# Patient Record
Sex: Male | Born: 1995 | Race: White | Hispanic: No | Marital: Single | Smoking: Former smoker
Health system: Southern US, Community
[De-identification: ages and names within clinical notes are randomized; demographics above are authoritative.]

## PROBLEM LIST (undated history)

## (undated) DIAGNOSIS — X838XXA Intentional self-harm by other specified means, initial encounter: Secondary | ICD-10-CM

## (undated) DIAGNOSIS — J45909 Unspecified asthma, uncomplicated: Secondary | ICD-10-CM

## (undated) DIAGNOSIS — F319 Bipolar disorder, unspecified: Secondary | ICD-10-CM

## (undated) HISTORY — PX: CHOLECYSTECTOMY: SHX55

---
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
## 2013-08-20 ENCOUNTER — Emergency Department (HOSPITAL_COMMUNITY): Payer: PRIVATE HEALTH INSURANCE

## 2013-08-20 ENCOUNTER — Emergency Department (HOSPITAL_COMMUNITY)
Admission: EM | Admit: 2013-08-20 | Discharge: 2013-08-27 | Disposition: A | Discharge status: Other Acute Inpatient Hospital | Payer: PRIVATE HEALTH INSURANCE | Attending: Emergency Medicine | Admitting: Emergency Medicine

## 2013-08-20 ENCOUNTER — Encounter (HOSPITAL_COMMUNITY): Payer: Self-pay | Admitting: *Deleted

## 2013-08-20 DIAGNOSIS — Y939 Activity, unspecified: Secondary | ICD-10-CM | POA: Insufficient documentation

## 2013-08-20 DIAGNOSIS — W3400XA Accidental discharge from unspecified firearms or gun, initial encounter: Secondary | ICD-10-CM

## 2013-08-20 DIAGNOSIS — Y9289 Other specified places as the place of occurrence of the external cause: Secondary | ICD-10-CM | POA: Insufficient documentation

## 2013-08-20 DIAGNOSIS — Y249XXA Unspecified firearm discharge, undetermined intent, initial encounter: Secondary | ICD-10-CM

## 2013-08-20 DIAGNOSIS — X72XXXA Intentional self-harm by handgun discharge, initial encounter: Secondary | ICD-10-CM | POA: Insufficient documentation

## 2013-08-20 DIAGNOSIS — F319 Bipolar disorder, unspecified: Secondary | ICD-10-CM | POA: Insufficient documentation

## 2013-08-20 DIAGNOSIS — S81009A Unspecified open wound, unspecified knee, initial encounter: Secondary | ICD-10-CM | POA: Insufficient documentation

## 2013-08-20 HISTORY — DX: Bipolar disorder, unspecified: F31.9

## 2013-08-20 HISTORY — DX: Unspecified asthma, uncomplicated: J45.909

## 2013-08-20 LAB — CBC WITH DIFFERENTIAL/PLATELET
Eosinophils Relative: 1 % (ref 0–5)
Hemoglobin: 14.7 g/dL (ref 13.0–17.0)
Lymphocytes Relative: 25 % (ref 12–46)
Lymphs Abs: 2.5 10*3/uL (ref 0.7–4.0)
MCV: 91 fL (ref 78.0–100.0)
Monocytes Relative: 8 % (ref 3–12)
Platelets: 250 10*3/uL (ref 150–400)
RBC: 4.68 MIL/uL (ref 4.22–5.81)
WBC: 9.7 10*3/uL (ref 4.0–10.5)

## 2013-08-20 LAB — SALICYLATE LEVEL: Salicylate Lvl: <2 mg/dL — ABNORMAL LOW (ref 2.8–20.0)

## 2013-08-20 LAB — BASIC METABOLIC PANEL
BUN: 10 mg/dL (ref 6–23)
CO2: 24 mEq/L (ref 19–32)
Calcium: 9.4 mg/dL (ref 8.4–10.5)
Glucose, Bld: 121 mg/dL — ABNORMAL HIGH (ref 70–99)
Potassium: 4.1 mEq/L (ref 3.5–5.1)
Sodium: 137 mEq/L (ref 135–145)

## 2013-08-20 LAB — ETHANOL: Alcohol, Ethyl (B): <11 mg/dL (ref 0–11)

## 2013-08-20 LAB — RAPID URINE DRUG SCREEN, HOSP PERFORMED
Benzodiazepines: NOT DETECTED
Cocaine: NOT DETECTED

## 2013-08-20 MED ORDER — OLANZAPINE 5 MG PO TABS
20.0000 mg | ORAL_TABLET | Freq: Every day | ORAL | Status: DC
  Administered 2013-08-20 – 2013-08-26 (×7): 20 mg via ORAL
  Filled 2013-08-20 (×8): qty 4

## 2013-08-20 MED ORDER — IBUPROFEN 400 MG PO TABS
600.0000 mg | ORAL_TABLET | Freq: Three times a day (TID) | ORAL | Status: DC | PRN
  Administered 2013-08-20 – 2013-08-27 (×9): 600 mg via ORAL
  Filled 2013-08-20: qty 3
  Filled 2013-08-20: qty 1
  Filled 2013-08-20 (×2): qty 3
  Filled 2013-08-20 (×2): qty 1
  Filled 2013-08-20: qty 3
  Filled 2013-08-20 (×2): qty 1
  Filled 2013-08-20 (×2): qty 3
  Filled 2013-08-20: qty 1

## 2013-08-20 MED ORDER — ONDANSETRON HCL 4 MG PO TABS: 4.0000 mg | ORAL_TABLET | Freq: Three times a day (TID) | ORAL | Status: DC | PRN

## 2013-08-20 MED ORDER — TETANUS-DIPHTH-ACELL PERTUSSIS 5-2.5-18.5 LF-MCG/0.5 IM SUSP
0.5000 mL | Freq: Once | INTRAMUSCULAR | Status: AC
  Administered 2013-08-20: 0.5 mL via INTRAMUSCULAR
  Filled 2013-08-20: qty 0.5

## 2013-08-20 MED ORDER — CLONIDINE HCL 0.2 MG PO TABS
0.3000 mg | ORAL_TABLET | Freq: Every day | ORAL | Status: DC
  Administered 2013-08-20 – 2013-08-26 (×6): 0.3 mg via ORAL
  Filled 2013-08-20 (×14): qty 1

## 2013-08-20 MED ORDER — DIVALPROEX SODIUM ER 500 MG PO TB24
1500.0000 mg | ORAL_TABLET | Freq: Every day | ORAL | Status: DC
  Administered 2013-08-20 – 2013-08-26 (×7): 1500 mg via ORAL
  Filled 2013-08-20 (×13): qty 3

## 2013-08-20 MED ORDER — NICOTINE 21 MG/24HR TD PT24
21.0000 mg | MEDICATED_PATCH | Freq: Every day | TRANSDERMAL | Status: DC
  Administered 2013-08-20 – 2013-08-27 (×8): 21 mg via TRANSDERMAL
  Filled 2013-08-20 (×8): qty 1

## 2013-08-20 MED ORDER — CEFAZOLIN SODIUM 1-5 GM-% IV SOLN
1.0000 g | Freq: Once | INTRAVENOUS | Status: AC
  Administered 2013-08-20: 1 g via INTRAVENOUS
  Filled 2013-08-20: qty 50

## 2013-08-20 MED ORDER — HYDROMORPHONE HCL PF 1 MG/ML IJ SOLN
1.0000 mg | Freq: Once | INTRAMUSCULAR | Status: AC
  Administered 2013-08-20: 1 mg via INTRAVENOUS
  Filled 2013-08-20: qty 1

## 2013-08-20 NOTE — ED Notes (Signed)
Dressing removed and new dressing applied.  Medium amount of blood noted to dressing from wound.

## 2013-08-20 NOTE — ED Notes (Signed)
Pt doing tele-psych via computer.

## 2013-08-20 NOTE — ED Notes (Signed)
Pts mom, Brazen Domangue called and wanted to be informed when pt was admitted elsewhere.  Call [phone].

## 2013-08-20 NOTE — ED Notes (Signed)
Meal tray provided.
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
## 2013-08-20 NOTE — ED Notes (Signed)
Pts mother took his belongings home with her.

## 2013-08-20 NOTE — ED Notes (Signed)
Pt waiting for tele psych assessment to be done.

## 2013-08-20 NOTE — [facility] Counselor (Signed)
Pt declined [facility], per Verne Spurr, PA the pt requires a higher level of care. Pt information will be passed to [employer] at [facility] to assist with placement.Denice Bors, [facility] 08/20/2013 5:12 PM
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
## 2013-08-20 NOTE — ED Notes (Addendum)
Breakfast meal tray provided.

## 2013-08-20 NOTE — ED Notes (Signed)
Patient took a shower.

## 2013-08-20 NOTE — ED Notes (Signed)
PT requests crutches to be able to ambulate better. EDP notified

## 2013-08-20 NOTE — ED Notes (Signed)
Sitter at bedside pt ate breakfast up to shower rt leg wrapped w/ bags to prevent it getting wet

## 2013-08-21 LAB — CBC
HCT: 42.3 % (ref 39.0–52.0)
MCH: 32.5 pg (ref 26.0–34.0)
MCHC: 35.2 g/dL (ref 30.0–36.0)
MCV: 92.2 fL (ref 78.0–100.0)
Platelets: 212 10*3/uL (ref 150–400)
RBC: 4.59 MIL/uL (ref 4.22–5.81)
WBC: 8.4 10*3/uL (ref 4.0–10.5)

## 2013-08-21 NOTE — Progress Notes (Signed)
B.Duru Reiger, MHT notes that [facility] has declined patient due to acuity. [facility] is reviewing for wait list per [facility] who will confirm wait list later on today.

## 2013-08-21 NOTE — Progress Notes (Signed)
B.Mainor Hellmann, MHT completed [facility] referral with [facility] clinician Clifford obtained authorization number [id number] for 7 days beginning 08/21/13 to 08/27/13. Writer contacted [facility] spoke with Merlyn Albert and requested to submit referral for wait list. Referral packet faxed for review inclusive of all labs, assessment notes, ED provider notes, legal documentation, MAR. Writer will follow up with status of referral

## 2013-08-21 NOTE — Progress Notes (Signed)
B.Guillermina Shaft, MHT received report from [facility] who has declined due to medical acuity. Spoke with Solmon Ice at [facility] who reports a decline as well due to medical acuity. Writer will continue placement search efforts.

## 2013-08-21 NOTE — Progress Notes (Signed)
B.Jayden Kratochvil, MHT completed placement search by contacting the following facilities listed below;   [facility] per [place] fax for review [facility] per Olegario Messier fax for review [facility] per [facility] fax [facility] review [facility] per [facility] fax for review [facility] per [place] fax for review [facility] per [place] at [employer] can send for review with referral form. Referral form completed and faxed along with assessment and labs [facility] per [place] at [facility] per [place] fax for review. Albin Felling reports that fax was received but did not meet cut off will need to resend on next day Shirleen Schirmer per [place] fax for review [place] per [employer] for review [place] per [employer] fax for review [employer] per [place] fax for review [facility] per [place] fax for review [employer] per [employer] for review [employer] per [place] fax for review
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
## 2013-08-21 NOTE — Progress Notes (Signed)
B.Deshia Vanderhoof, MHT received report from [place] at [facility] that patient has been added to wait list as of 11:11 am 08/21/13.
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
## 2013-08-21 NOTE — ED Notes (Signed)
Spoke with Dr. Criss Alvine-- ok to take shower. Pt. Aware.

## 2013-08-22 NOTE — ED Provider Notes (Signed)
Patient is on [facility] waiting list.  Kyle Wang. Rubin Payor, MD 08/22/13 1253

## 2013-08-22 NOTE — BH Assessment (Signed)
Per Okey Regal at [facility], pt is still on [facility] wait list.    Evette Cristal, [place] Assessment Counselor

## 2013-08-23 NOTE — ED Notes (Signed)
Pt able to ambulate to bathroom without assistance.  Per pt no needs or desires at this time

## 2013-08-23 NOTE — Progress Notes (Signed)
C. Natavia Sublette, MHT called [facility] and patient is still on the wait list as of 3:37 pm 08/23/2013.

## 2013-08-23 NOTE — ED Notes (Signed)
Mother called to check on pt. Update given

## 2013-08-23 NOTE — ED Provider Notes (Signed)
Patient resting comfortably this morning with no overnight complaints per staff. Patient is on [facility] waiting list.  Gilda Crease, MD 08/23/13 [phone]
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
## 2013-08-24 NOTE — BH Assessment (Signed)
[facility] Assessment Progress Note Called [facility] and scheduled pt's tele assessment (reassessment) for 0945.
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
## 2013-08-25 NOTE — BH Assessment (Signed)
[facility] Assessment Progress Note  Update:  Pt confirmed on [facility] wait list per Coralee North @ 1355.

## 2013-08-25 NOTE — ED Notes (Signed)
Patient resting with eyes closed appears to be sleeping. Patient consumed all of breakfast.

## 2013-08-25 NOTE — Progress Notes (Signed)
Spoke with Okey Regal @ 11:38 Confirmed that Pt is still on the waiting list.

## 2013-08-25 NOTE — ED Notes (Signed)
Mother of patient called at this time.

## 2013-08-25 NOTE — ED Provider Notes (Signed)
Case discussed with Noland Fordyce, MHT/NS.  He is awaiting placement at [facility] and is currently on the waiting list. No issues during my shift.  Darlys Gales, MD 08/25/13 2036

## 2013-08-26 MED ORDER — DIPHENHYDRAMINE HCL 25 MG PO CAPS
25.0000 mg | ORAL_CAPSULE | Freq: Once | ORAL | Status: AC
  Administered 2013-08-26: 25 mg via ORAL
  Filled 2013-08-26: qty 1

## 2013-08-26 MED ORDER — CEPHALEXIN 250 MG PO CAPS
500.0000 mg | ORAL_CAPSULE | Freq: Four times a day (QID) | ORAL | Status: DC
  Administered 2013-08-26 – 2013-08-27 (×5): 500 mg via ORAL
  Filled 2013-08-26 (×5): qty 2

## 2013-08-26 NOTE — Progress Notes (Signed)
Confirmed pt on waitlist at [facility] with Joy.  Blain Pais, MHT/NS
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
## 2013-08-26 NOTE — BH Assessment (Signed)
Confirmed that patient is on the [facility] wait list. Spoke to [facility] 08/26/2013 @ 0739.

## 2013-08-27 DIAGNOSIS — F319 Bipolar disorder, unspecified: Secondary | ICD-10-CM

## 2013-08-27 NOTE — ED Notes (Signed)
Pt. Having  Rt. Leg pain medicated per orders with ibuprofen 600 mg po
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
## 2013-08-27 NOTE — BH Assessment (Signed)
[name] at [facility] confirms pt remains on their wait list.  Evette Cristal, [place] Assessment Counselor

## 2013-08-27 NOTE — BH Assessment (Signed)
[facility] Assessment Progress Note      Spoke with Museum/gallery conservator at [facility] who reports that EDP Hyacinth Meeker is concerned about putting Renee Pain, accepting RN's name on Mr Loudermilk's EMTALA form.  Called [facility] and spoke with Onalee Hua who stated that there policy is to give the accepting RN's name and not a physician's name because they do not know which physician will be serving as the attending so that would be a lie.  He reports that this is legal procedure.  Relayed this information to Dr Eber Hong who states it was his understanding that it was not legal for a nurse to accept care of the patient, but he is willing to do so at the assertion of [facility] that this is legal because he does not want to delay Mr Alcantar's care.

## 2013-08-27 NOTE — ED Notes (Signed)
Dressing placed on the RLE.  Telepsych started.

## 2013-08-27 NOTE — ED Provider Notes (Signed)
I discussed the care of this patient with Renee Pain, this is a nurse at [facility], she states that it is okay to transfer a patient to a nurse using [place] documentation.  They refuse to give me the name of an accepting physician stating that this is normal operating procedure for adult patients accepted to their unit.  Pt appears stable for transfer  Vida Roller, MD 08/27/13 1744

## 2013-08-27 NOTE — Progress Notes (Signed)
Kyle Wang, MHT received report from [place] at [facility] that patient has been accepted for treatment and can be transported on today. Kyle Wang has requested up to date MAR, vitals, and updated legal documentation (IVC). Writer compiled requested information and faxed to [facility] and arranged for transport with [facility] contact person Kyle Wang. Kyle Wang patient to be transferred today at 7pm..

## 2013-08-27 NOTE — BH Assessment (Signed)
Per conversation with Dr. Higinio Plan patient may be able to discharge home. Per his Dr. Brandy Hale request, this patient will need to be evaluated by a physician or PA/NP. EDP also requesting a note in EPIC by psychiatry or PA/NP (with physician signing off on PA/NP's note).  Writer has attempted to notify the the on call NP-Laura Earlene Plater but she is currently seeing patients on the unit. Writer has therefore notified Fransisca Kaufmann via typed note (left on her work area) and left a message for her with the nursing secretary/tech Tiara asking that she see's this patient.   Patient will continue to remain in the ED until psychiatry or midlevel makes further recommendations.

## 2013-08-27 NOTE — ED Notes (Signed)
Report given to Renee Pain RN at [facility].

## 2013-08-27 NOTE — ED Notes (Signed)
Dr. Patria Mane informed of patient complaints.

## 2013-08-27 NOTE — ED Notes (Signed)
Pt's mother at the bedside.

## 2013-08-27 NOTE — ED Notes (Signed)
Reordered pt.s lunch, hamburger and ff.
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
## 2013-08-27 NOTE — ED Provider Notes (Addendum)
Filed Vitals:   08/27/13 1121  BP: 110/54  Pulse: 71  Temp: 98.2 F (36.8 C)  Resp: 20    Patient awaitng placement to [facility]. No complains or concerns from patient or RN at this time.   Derwood Kaplan, MD 08/27/13 1333  Derwood Kaplan, MD 08/27/13 1334

## 2013-08-28 NOTE — Consult Note (Signed)
Patient needs longer length of inpatient care and with the history of aggression, needs to be referred to [facility]

## 2016-05-06 ENCOUNTER — Encounter: Payer: Self-pay | Admitting: Emergency Medicine

## 2016-05-06 ENCOUNTER — Emergency Department
Admission: EM | Admit: 2016-05-06 | Discharge: 2016-05-07 | Disposition: A | Discharge status: Home / Self Care | Payer: Medicaid Other | Attending: Emergency Medicine | Admitting: Emergency Medicine

## 2016-05-06 DIAGNOSIS — Z79899 Other long term (current) drug therapy: Secondary | ICD-10-CM | POA: Insufficient documentation

## 2016-05-06 DIAGNOSIS — R0789 Other chest pain: Secondary | ICD-10-CM | POA: Insufficient documentation

## 2016-05-06 DIAGNOSIS — F17210 Nicotine dependence, cigarettes, uncomplicated: Secondary | ICD-10-CM | POA: Insufficient documentation

## 2016-05-06 DIAGNOSIS — F319 Bipolar disorder, unspecified: Secondary | ICD-10-CM | POA: Diagnosis not present

## 2016-05-06 DIAGNOSIS — J45909 Unspecified asthma, uncomplicated: Secondary | ICD-10-CM | POA: Insufficient documentation

## 2016-05-06 LAB — CBC WITH DIFFERENTIAL/PLATELET
Basophils Absolute: 0.1 10*3/uL (ref 0–0.1)
Basophils Relative: 1 %
EOS ABS: 0.1 10*3/uL (ref 0–0.7)
Eosinophils Relative: 2 %
HEMATOCRIT: 42.2 % (ref 40.0–52.0)
HEMOGLOBIN: 14.4 g/dL (ref 13.0–18.0)
LYMPHS ABS: 2.5 10*3/uL (ref 1.0–3.6)
Lymphocytes Relative: 37 %
MCH: 30.5 pg (ref 26.0–34.0)
MCHC: 34.2 g/dL (ref 32.0–36.0)
MCV: 89.2 fL (ref 80.0–100.0)
Monocytes Absolute: 0.6 10*3/uL (ref 0.2–1.0)
Monocytes Relative: 9 %
NEUTROS ABS: 3.5 10*3/uL (ref 1.4–6.5)
NEUTROS PCT: 51 %
Platelets: 172 10*3/uL (ref 150–440)
RBC: 4.73 MIL/uL (ref 4.40–5.90)
RDW: 13.7 % (ref 11.5–14.5)
WBC: 6.8 10*3/uL (ref 3.8–10.6)

## 2016-05-06 LAB — BASIC METABOLIC PANEL
ANION GAP: 11 (ref 5–15)
BUN: 10 mg/dL (ref 6–20)
CHLORIDE: 103 mmol/L (ref 101–111)
CO2: 24 mmol/L (ref 22–32)
CREATININE: 0.77 mg/dL (ref 0.61–1.24)
Calcium: 9.5 mg/dL (ref 8.9–10.3)
GFR calc Af Amer: >60 mL/min (ref >60)
GFR calc non Af Amer: >60 mL/min (ref >60)
Glucose, Bld: 175 mg/dL — ABNORMAL HIGH (ref 65–99)
Potassium: 3.3 mmol/L — ABNORMAL LOW (ref 3.5–5.1)
SODIUM: 138 mmol/L (ref 135–145)

## 2016-05-06 LAB — FIBRIN DERIVATIVES D-DIMER (ARMC ONLY): FIBRIN DERIVATIVES D-DIMER (ARMC): 162 (ref 0–499)

## 2016-05-06 LAB — TROPONIN I: Troponin I: <0.03 ng/mL (ref <0.031)

## 2016-05-06 MED ORDER — KETOROLAC TROMETHAMINE 30 MG/ML IJ SOLN
INTRAMUSCULAR | Status: AC
  Filled 2016-05-06: qty 1

## 2016-05-06 MED ORDER — SODIUM CHLORIDE 0.9 % IV BOLUS (SEPSIS)
1000.0000 mL | Freq: Once | INTRAVENOUS | Status: AC
  Administered 2016-05-06: 1000 mL via INTRAVENOUS

## 2016-05-06 MED ORDER — KETOROLAC TROMETHAMINE 30 MG/ML IJ SOLN
10.0000 mg | Freq: Once | INTRAMUSCULAR | Status: AC
  Administered 2016-05-06: 9.9 mg via INTRAVENOUS

## 2016-05-06 MED ORDER — FAMOTIDINE 20 MG PO TABS: 20.0000 mg | ORAL_TABLET | Freq: Two times a day (BID) | ORAL | Status: DC

## 2016-05-06 NOTE — ED Notes (Signed)
Pt on phone speaking with mother.  

## 2016-05-06 NOTE — ED Notes (Signed)
Pt states left sided chest pain that began approx 40 min [pta. Ems gave 324mg  asa. Pt states is stabbing and has associated shob and dizziness, pt appears in no acute distress.

## 2016-05-06 NOTE — ED Provider Notes (Signed)
[facility] Emergency Department Provider Note  ____________________________________________  Time seen: 8:45 PM  I have reviewed the triage vital signs and the nursing notes.   HISTORY  Chief Complaint Chest Pain    HPI Kyle Wang is a 20 y.o. male who complains of chest pain that began about 8 PM tonight. Left side of the chest, rating to left arm. Sharp and stabbing, associated shortness of breath dizziness and nausea but no vomiting. No diaphoresis. No syncope. Not exertional. Not pleuritic. No aggravating or alleviating factors. Moderate intensity.  No recent travel, hospitalization or surgery. No history of DVT or PE personally or in his family. Does not feel like his asthma. No recent illness.     Past Medical History  Diagnosis Date  . Bipolar 1 disorder (HCC)   . Asthma      There are no active problems to display for this patient.    No past surgical history on file.   Current Outpatient Rx  Name  Route  Sig  Dispense  Refill  . albuterol (PROVENTIL HFA;VENTOLIN HFA) 108 (90 Base) MCG/ACT inhaler   Inhalation   Inhale 2 puffs into the lungs every 6 (six) hours as needed for wheezing or shortness of breath.         . divalproex (DEPAKOTE ER) 500 MG 24 hr tablet   Oral   Take 500 mg by mouth 2 (two) times daily.          Marland Kitchen escitalopram (LEXAPRO) 10 MG tablet   Oral   Take 10 mg by mouth at bedtime.         . lactobacillus acidophilus (BACID) TABS tablet   Oral   Take 1 tablet by mouth daily.         Marland Kitchen LORazepam (ATIVAN) 1 MG tablet   Oral   Take 1 mg by mouth 3 (three) times daily.         . Melatonin 3 MG TABS   Oral   Take 3 mg by mouth at bedtime.         Marland Kitchen OLANZapine (ZYPREXA) 20 MG tablet   Oral   Take 20 mg by mouth at bedtime.         Marland Kitchen OLANZapine (ZYPREXA) 5 MG tablet   Oral   Take 5 mg by mouth every morning.         Marland Kitchen omeprazole (PRILOSEC) 40 MG capsule   Oral   Take 40 mg by  mouth every evening.         . famotidine (PEPCID) 20 MG tablet   Oral   Take 1 tablet (20 mg total) by mouth 2 (two) times daily.   60 tablet   0      Allergies Review of patient's allergies indicates no known allergies.   Family History  Problem Relation Age of Onset  . Adopted: Yes    Social History Social History  Substance Use Topics  . Smoking status: Current Every Day Smoker -- 2.00 packs/day    Types: Cigarettes    Last Attempt to Quit: 08/18/2013  . Smokeless tobacco: Current User  . Alcohol Use: Yes     Comment: 1/5th of brandy per month. drank 8oz of beer today.    Review of Systems  Constitutional:   No fever or chills.  Eyes:   No vision changes.  ENT:   No sore throat. No rhinorrhea. Cardiovascular:   Positive as above chest pain. Respiratory:   Shortness of breath,  no cough.. Gastrointestinal:   Negative for abdominal pain, vomiting and diarrhea.  Genitourinary:   Negative for dysuria or difficulty urinating. Musculoskeletal:   Negative for focal pain or swelling Neurological:   Negative for headaches 10-point ROS otherwise negative.  ____________________________________________   PHYSICAL EXAM:  VITAL SIGNS: ED Triage Vitals  Enc Vitals Group     BP 05/06/16 2046 164/84 mmHg     Pulse Rate 05/06/16 2043 120     Resp 05/06/16 2043 20     Temp 05/06/16 2043 98.8 F (37.1 C)     Temp Source 05/06/16 2043 Oral     SpO2 05/06/16 2045 96 %     Weight 05/06/16 2043 222 lb (100.699 kg)     Height 05/06/16 2043  (1.727 m)     Head Cir --      Peak Flow --      Pain Score 05/06/16 2044 7     Pain Loc --      Pain Edu? --      Excl. in GC? --     Vital signs reviewed, nursing assessments reviewed.   Constitutional:   Alert and oriented. Well appearing and in no distress. Eyes:   No scleral icterus. No conjunctival pallor. PERRL. EOMI.  No nystagmus. ENT   Head:   Normocephalic and atraumatic.   Nose:   No  congestion/rhinnorhea. No septal hematoma   Mouth/Throat:   MMM, no pharyngeal erythema. No peritonsillar mass.    Neck:   No stridor. No SubQ emphysema. No meningismus. Hematological/Lymphatic/Immunilogical:   No cervical lymphadenopathy. Cardiovascular:   RRR. Symmetric bilateral radial and DP pulses.  No murmurs.  Respiratory:   Normal respiratory effort without tachypnea nor retractions. Breath sounds are clear and equal bilaterally. No wheezes/rales/rhonchi.No inducible wheezing or coughing on forceful expiration Gastrointestinal:   Soft and nontender. Non distended. There is no CVA tenderness.  No rebound, rigidity, or guarding. Genitourinary:   deferred Musculoskeletal:   Nontender with normal range of motion in all extremities. No joint effusions.  No lower extremity tenderness.  No edema. Chest wall nontender Neurologic:   Normal speech and language.  CN 2-10 normal. Motor grossly intact. No gross focal neurologic deficits are appreciated.  Skin:    Skin is warm, dry and intact. No rash noted.  No petechiae, purpura, or bullae.  ____________________________________________    LABS (pertinent positives/negatives) (all labs ordered are listed, but only abnormal results are displayed) Labs Reviewed  BASIC METABOLIC PANEL - Abnormal; Notable for the following:    Potassium 3.3 (*)    Glucose, Bld 175 (*)    All other components within normal limits  CBC WITH DIFFERENTIAL/PLATELET  TROPONIN I  FIBRIN DERIVATIVES D-DIMER ([facility] ONLY)  CBC WITH DIFFERENTIAL/PLATELET  TROPONIN I   ____________________________________________   EKG  EKG interpreted by me Sinus tachycardia rate 113, normal axis intervals QRS ST segments and T waves. No evidence of right heart strain on EKG.  ____________________________________________    RADIOLOGY    ____________________________________________   PROCEDURES   ____________________________________________   INITIAL  IMPRESSION / ASSESSMENT AND PLAN / ED COURSE  Pertinent labs & imaging results that were available during my care of the patient were reviewed by me and considered in my medical decision making (see chart for details).  Patient well appearing no acute distress. Initial labs unremarkable including d-dimer. EKG unremarkable. Tachycardia resolved as did the pain itself. Patient calm and sleeping in the emergency department. Repeat troponin at about 11:45 PM,  if negative patient is stable for outpatient follow-up. Possible anxiety attack versus GERD.Considering the patient's symptoms, medical history, and physical examination today, I have low suspicion for ACS, PE, TAD, pneumothorax, carditis, mediastinitis, pneumonia, CHF, or sepsis.  Care patient signed out to Dr. Dolores FrameSung to follow-up on second troponin with plan for discharge unless there is a new abnormality.     ____________________________________________   FINAL CLINICAL IMPRESSION(S) / ED DIAGNOSES  Final diagnoses:  Atypical chest pain       Portions of this note were generated with dragon dictation software. Dictation errors may occur despite best attempts at proofreading.   Sharman CheekPhillip Glenden Rossell, MD 05/06/16 [phone]

## 2016-05-06 NOTE — Discharge Instructions (Signed)
Nonspecific Chest Pain  °Chest pain can be caused by many different conditions. There is always a chance that your pain could be related to something serious, such as a heart attack or a blood clot in your lungs. Chest pain can also be caused by conditions that are not life-threatening. If you have chest pain, it is very important to follow up with your health care provider. °CAUSES  °Chest pain can be caused by: °· Heartburn. °· Pneumonia or bronchitis. °· Anxiety or stress. °· Inflammation around your heart (pericarditis) or lung (pleuritis or pleurisy). °· A blood clot in your lung. °· A collapsed lung (pneumothorax). It can develop suddenly on its own (spontaneous pneumothorax) or from trauma to the chest. °· Shingles infection (varicella-zoster virus). °· Heart attack. °· Damage to the bones, muscles, and cartilage that make up your chest wall. This can include: °¨ Bruised bones due to injury. °¨ Strained muscles or cartilage due to frequent or repeated coughing or overwork. °¨ Fracture to one or more ribs. °¨ Sore cartilage due to inflammation (costochondritis). °RISK FACTORS  °Risk factors for chest pain may include: °· Activities that increase your risk for trauma or injury to your chest. °· Respiratory infections or conditions that cause frequent coughing. °· Medical conditions or overeating that can cause heartburn. °· Heart disease or family history of heart disease. °· Conditions or health behaviors that increase your risk of developing a blood clot. °· Having had chicken pox (varicella zoster). °SIGNS AND SYMPTOMS °Chest pain can feel like: °· Burning or tingling on the surface of your chest or deep in your chest. °· Crushing, pressure, aching, or squeezing pain. °· Dull or sharp pain that is worse when you move, cough, or take a deep breath. °· Pain that is also felt in your back, neck, shoulder, or arm, or pain that spreads to any of these areas. °Your chest pain may come and go, or it may stay  constant. °DIAGNOSIS °Lab tests or other studies may be needed to find the cause of your pain. Your health care provider may have you take a test called an ambulatory ECG (electrocardiogram). An ECG records your heartbeat patterns at the time the test is performed. You may also have other tests, such as: °· Transthoracic echocardiogram (TTE). During echocardiography, sound waves are used to create a picture of all of the heart structures and to look at how blood flows through your heart. °· Transesophageal echocardiogram (TEE). This is a more advanced imaging test that obtains images from inside your body. It allows your health care provider to see your heart in finer detail. °· Cardiac monitoring. This allows your health care provider to monitor your heart rate and rhythm in real time. °· Holter monitor. This is a portable device that records your heartbeat and can help to diagnose abnormal heartbeats. It allows your health care provider to track your heart activity for several days, if needed. °· Stress tests. These can be done through exercise or by taking medicine that makes your heart beat more quickly. °· Blood tests. °· Imaging tests. °TREATMENT  °Your treatment depends on what is causing your chest pain. Treatment may include: °· Medicines. These may include: °¨ Acid blockers for heartburn. °¨ Anti-inflammatory medicine. °¨ Pain medicine for inflammatory conditions. °¨ Antibiotic medicine, if an infection is present. °¨ Medicines to dissolve blood clots. °¨ Medicines to treat coronary artery disease. °· Supportive care for conditions that do not require medicines. This may include: °¨ Resting. °¨ Applying heat   or cold packs to injured areas. °¨ Limiting activities until pain decreases. °HOME CARE INSTRUCTIONS °· If you were prescribed an antibiotic medicine, finish it all even if you start to feel better. °· Avoid any activities that bring on chest pain. °· Do not use any tobacco products, including  cigarettes, chewing tobacco, or electronic cigarettes. If you need help quitting, ask your health care provider. °· Do not drink alcohol. °· Take medicines only as directed by your health care provider. °· Keep all follow-up visits as directed by your health care provider. This is important. This includes any further testing if your chest pain does not go away. °· If heartburn is the cause for your chest pain, you may be told to keep your head raised (elevated) while sleeping. This reduces the chance that acid will go from your stomach into your esophagus. °· Make lifestyle changes as directed by your health care provider. These may include: °¨ Getting regular exercise. Ask your health care provider to suggest some activities that are safe for you. °¨ Eating a heart-healthy diet. A registered dietitian can help you to learn healthy eating options. °¨ Maintaining a healthy weight. °¨ Managing diabetes, if necessary. °¨ Reducing stress. °SEEK MEDICAL CARE IF: °· Your chest pain does not go away after treatment. °· You have a rash with blisters on your chest. °· You have a fever. °SEEK IMMEDIATE MEDICAL CARE IF:  °· Your chest pain is worse. °· You have an increasing cough, or you cough up blood. °· You have severe abdominal pain. °· You have severe weakness. °· You faint. °· You have chills. °· You have sudden, unexplained chest discomfort. °· You have sudden, unexplained discomfort in your arms, back, neck, or jaw. °· You have shortness of breath at any time. °· You suddenly start to sweat, or your skin gets clammy. °· You feel nauseous or you vomit. °· You suddenly feel light-headed or dizzy. °· Your heart begins to beat quickly, or it feels like it is skipping beats. °These symptoms may represent a serious problem that is an emergency. Do not wait to see if the symptoms will go away. Get medical help right away. Call your local emergency services (911 in the [place] Do not drive yourself to the hospital. °  °This  information is not intended to replace advice given to you by your health care provider. Make sure you discuss any questions you have with your health care provider. °  °Document Released: 08/17/2005 Document Revised: 11/28/2014 Document Reviewed: 06/13/2014 °Elsevier Interactive Patient Education ©2016 [employer] ° °

## 2016-05-07 LAB — TROPONIN I

## 2016-05-15 ENCOUNTER — Encounter: Payer: Self-pay | Admitting: Emergency Medicine

## 2016-05-15 ENCOUNTER — Emergency Department
Admission: EM | Admit: 2016-05-15 | Discharge: 2016-05-15 | Disposition: A | Discharge status: Home / Self Care | Payer: Medicaid Other | Attending: Student | Admitting: Student

## 2016-05-15 ENCOUNTER — Emergency Department: Payer: Medicaid Other

## 2016-05-15 DIAGNOSIS — F17210 Nicotine dependence, cigarettes, uncomplicated: Secondary | ICD-10-CM | POA: Insufficient documentation

## 2016-05-15 DIAGNOSIS — J029 Acute pharyngitis, unspecified: Secondary | ICD-10-CM | POA: Diagnosis present

## 2016-05-15 DIAGNOSIS — F319 Bipolar disorder, unspecified: Secondary | ICD-10-CM | POA: Diagnosis not present

## 2016-05-15 DIAGNOSIS — J40 Bronchitis, not specified as acute or chronic: Secondary | ICD-10-CM | POA: Insufficient documentation

## 2016-05-15 DIAGNOSIS — J209 Acute bronchitis, unspecified: Secondary | ICD-10-CM

## 2016-05-15 LAB — POCT RAPID STREP A
STREPTOCOCCUS, GROUP A SCREEN (DIRECT): NEGATIVE
Streptococcus, Group A Screen (Direct): NEGATIVE

## 2016-05-15 MED ORDER — PREDNISONE 10 MG PO TABS: 50.0000 mg | ORAL_TABLET | Freq: Every day | ORAL | Status: DC

## 2016-05-15 MED ORDER — GUAIFENESIN-CODEINE 100-10 MG/5ML PO SYRP: 5.0000 mL | ORAL_SOLUTION | Freq: Three times a day (TID) | ORAL | Status: DC | PRN

## 2016-05-15 MED ORDER — IPRATROPIUM-ALBUTEROL 0.5-2.5 (3) MG/3ML IN SOLN
3.0000 mL | Freq: Once | RESPIRATORY_TRACT | Status: AC
  Administered 2016-05-15: 3 mL via RESPIRATORY_TRACT
  Filled 2016-05-15: qty 3

## 2016-05-15 MED ORDER — AZITHROMYCIN 250 MG PO TABS: ORAL_TABLET | ORAL | Status: DC

## 2016-05-15 NOTE — ED Provider Notes (Signed)
[facility] Emergency Department Provider Note  ____________________________________________  Time seen: Approximately 3:33 PM  I have reviewed the triage vital signs and the nursing notes.   HISTORY  Chief Complaint Sore Throat   HPI Kyle Wang is a 20 y.o. male who presents to the emergency department for evaluation of sore throat and cough x 4 days and developed hoarseness yesterday. He is an asthmatic and has been having to use his albuterol inhaler frequently. He denies dyspnea, but states deep breaths make his chest hurt. He denies known fever. He has been using Sudafed OTC with some relief of cough, but otherwise feels worse every day.   Past Medical History  Diagnosis Date  . Bipolar 1 disorder (HCC)   . Asthma     There are no active problems to display for this patient.   History reviewed. No pertinent past surgical history.  Current Outpatient Rx  Name  Route  Sig  Dispense  Refill  . albuterol (PROVENTIL HFA;VENTOLIN HFA) 108 (90 Base) MCG/ACT inhaler   Inhalation   Inhale 2 puffs into the lungs every 6 (six) hours as needed for wheezing or shortness of breath.         Marland Kitchen. azithromycin (ZITHROMAX) 250 MG tablet      2 tablets today, then 1 tablet for the next 4 days.   6 each   0   . divalproex (DEPAKOTE ER) 500 MG 24 hr tablet   Oral   Take 500 mg by mouth 2 (two) times daily.          Marland Kitchen. escitalopram (LEXAPRO) 10 MG tablet   Oral   Take 10 mg by mouth at bedtime.         . famotidine (PEPCID) 20 MG tablet   Oral   Take 1 tablet (20 mg total) by mouth 2 (two) times daily.   60 tablet   0   . guaiFENesin-codeine (ROBITUSSIN AC) 100-10 MG/5ML syrup   Oral   Take 5 mLs by mouth 3 (three) times daily as needed for cough.   120 mL   0   . lactobacillus acidophilus (BACID) TABS tablet   Oral   Take 1 tablet by mouth daily.         Marland Kitchen. LORazepam (ATIVAN) 1 MG tablet   Oral   Take 1 mg by mouth 3 (three) times  daily.         . Melatonin 3 MG TABS   Oral   Take 3 mg by mouth at bedtime.         Marland Kitchen. OLANZapine (ZYPREXA) 20 MG tablet   Oral   Take 20 mg by mouth at bedtime.         Marland Kitchen. OLANZapine (ZYPREXA) 5 MG tablet   Oral   Take 5 mg by mouth every morning.         Marland Kitchen. omeprazole (PRILOSEC) 40 MG capsule   Oral   Take 40 mg by mouth every evening.         . predniSONE (DELTASONE) 10 MG tablet   Oral   Take 5 tablets (50 mg total) by mouth daily.   25 tablet   0     Allergies Review of patient's allergies indicates no known allergies.  Family History  Problem Relation Age of Onset  . Adopted: Yes    Social History Social History  Substance Use Topics  . Smoking status: Current Every Day Smoker -- 2.00 packs/day    Types:  Cigarettes    Last Attempt to Quit: 08/18/2013  . Smokeless tobacco: Current User  . Alcohol Use: Yes     Comment: 1/5th of brandy per month. drank 8oz of beer today.    Review of Systems Constitutional: Negative for fever. Eyes: No visual changes. ENT: Positive for sore throat; negative for difficulty swallowing. Respiratory: Denies shortness of breath. Gastrointestinal: No abdominal pain.  No nausea, no vomiting.  No diarrhea.  Genitourinary: Negative for dysuria. Musculoskeletal: Positive for generalized body aches. Skin: Negative for rash. Neurological: Negative for headaches, focal weakness or numbness.  ____________________________________________   PHYSICAL EXAM:  VITAL SIGNS: ED Triage Vitals  Enc Vitals Group     BP 05/15/16 1450 122/75 mmHg     Pulse Rate 05/15/16 1450 73     Resp 05/15/16 1450 18     Temp 05/15/16 1450 98.9 F (37.2 C)     Temp Source 05/15/16 1450 Oral     SpO2 05/15/16 1450 95 %     Weight 05/15/16 1450 218 lb (98.884 kg)     Height 05/15/16 1450 5\' 8"  (1.727 m)     Head Cir --      Peak Flow --      Pain Score 05/15/16 1450 6     Pain Loc --      Pain Edu? --      Excl. in GC? --      Constitutional: Alert and oriented. Well appearing and in no acute distress. Eyes: Conjunctivae are normal. EOMI. Head: Atraumatic. Nose: No congestion/rhinnorhea. Mouth/Throat: Mucous membranes are moist.  Oropharynx mildly erythematous and tonsils are 2+ bilaterally, without exudate. Neck: No stridor.  Lymphatic: Lymphadenopathy: none noted Cardiovascular: Normal rate, regular rhythm. Good peripheral circulation. Respiratory: Normal respiratory effort. Expiratory wheezing noted throughout with rhonchi that does not clear with cough noted in the right upper lobe and left lower lobe. Gastrointestinal: Soft and nontender. Neurologic:  Normal speech and language. No gross focal neurologic deficits are appreciated. Hoarse voice. No gait instability. Skin:  Skin is warm, dry and intact. No rash noted Psychiatric: Mood and affect are normal. Speech and behavior are normal.  ____________________________________________   LABS (all labs ordered are listed, but only abnormal results are displayed)  Labs Reviewed  POCT RAPID STREP A  POCT RAPID STREP A   ____________________________________________  EKG   ____________________________________________  RADIOLOGY  Perhaps mild interstitial prominence, possibly indicating mild bronchiolitis. Lungs otherwise clear per radiology. ____________________________________________   PROCEDURES  Procedure(s) performed: None  Critical Care performed: No  ____________________________________________   INITIAL IMPRESSION / ASSESSMENT AND PLAN / ED COURSE  Pertinent labs & imaging results that were available during my care of the patient were reviewed by me and considered in my medical decision making (see chart for details).  Decrease in wheezing after DuoNeb while in the ER.  Patient was advised to follow up with his primary care provider if not improving over the next 2-3 days. He was advised to take the azithromycin, prednisone,  and robitussin AC as prescribed. He was advised to return to the ER for symptoms that change or worsen if unable to schedule an appointment.  ____________________________________________   FINAL CLINICAL IMPRESSION(S) / ED DIAGNOSES  Final diagnoses:  Bronchitis, acute, with bronchospasm      Chinita PesterCari B Amyiah Gaba, FNP 05/15/16 1639  Gayla DossEryka A Gayle, MD 05/15/16 2257

## 2016-05-15 NOTE — Discharge Instructions (Signed)

## 2016-05-15 NOTE — ED Notes (Signed)
Patient reports sore throat with cough x4 days. States he developed hoarseness yesterday.

## 2016-05-30 ENCOUNTER — Emergency Department: Payer: Medicaid Other

## 2016-05-30 ENCOUNTER — Encounter: Payer: Self-pay | Admitting: Emergency Medicine

## 2016-05-30 ENCOUNTER — Emergency Department
Admission: EM | Admit: 2016-05-30 | Discharge: 2016-05-30 | Disposition: A | Discharge status: Home / Self Care | Payer: Medicaid Other | Attending: Emergency Medicine | Admitting: Emergency Medicine

## 2016-05-30 DIAGNOSIS — Y999 Unspecified external cause status: Secondary | ICD-10-CM | POA: Diagnosis not present

## 2016-05-30 DIAGNOSIS — F319 Bipolar disorder, unspecified: Secondary | ICD-10-CM | POA: Insufficient documentation

## 2016-05-30 DIAGNOSIS — Y929 Unspecified place or not applicable: Secondary | ICD-10-CM | POA: Insufficient documentation

## 2016-05-30 DIAGNOSIS — S86912A Strain of unspecified muscle(s) and tendon(s) at lower leg level, left leg, initial encounter: Secondary | ICD-10-CM

## 2016-05-30 DIAGNOSIS — W1839XA Other fall on same level, initial encounter: Secondary | ICD-10-CM | POA: Insufficient documentation

## 2016-05-30 DIAGNOSIS — S86812A Strain of other muscle(s) and tendon(s) at lower leg level, left leg, initial encounter: Secondary | ICD-10-CM | POA: Diagnosis not present

## 2016-05-30 DIAGNOSIS — Y9301 Activity, walking, marching and hiking: Secondary | ICD-10-CM | POA: Diagnosis not present

## 2016-05-30 DIAGNOSIS — S8992XA Unspecified injury of left lower leg, initial encounter: Secondary | ICD-10-CM | POA: Diagnosis present

## 2016-05-30 DIAGNOSIS — F17210 Nicotine dependence, cigarettes, uncomplicated: Secondary | ICD-10-CM | POA: Diagnosis not present

## 2016-05-30 DIAGNOSIS — J45909 Unspecified asthma, uncomplicated: Secondary | ICD-10-CM | POA: Insufficient documentation

## 2016-05-30 DIAGNOSIS — S8002XA Contusion of left knee, initial encounter: Secondary | ICD-10-CM

## 2016-05-30 MED ORDER — IBUPROFEN 800 MG PO TABS: 800.0000 mg | ORAL_TABLET | Freq: Three times a day (TID) | ORAL | Status: DC | PRN

## 2016-05-30 MED ORDER — HYDROCODONE-ACETAMINOPHEN 5-325 MG PO TABS: 1.0000 | ORAL_TABLET | ORAL | Status: DC | PRN

## 2016-05-30 NOTE — Discharge Instructions (Signed)

## 2016-05-30 NOTE — ED Notes (Signed)
Pt presents with compression knee sleeve on L knee. Ice applied to L knee. Pt states hx of of ACL tear in L knee with 6 months rehab. Pt states knee gave out, knee hit door and then hardwood floor and states cracking sound with movement.

## 2016-05-30 NOTE — ED Notes (Signed)
Spoke to Mother legally guardian with concern about crutchs because group home will discharge him. [facility] PA aware and discontinue orders for crutchs. She said she would call the group home to come and get him.

## 2016-05-30 NOTE — ED Notes (Signed)
Pt arrived to the ED for complaints of left knee pain. Pt states that today he was walking and his "knee gave out on him" falling on the injured knee. Pt reports having a previous injury to the same knee and he was told that he has a torn ligament. Pt is AOx4 in no apparent distress.

## 2016-05-30 NOTE — ED Provider Notes (Signed)
[facility] Emergency Department Provider Note  ____________________________________________  Time seen: Approximately 7:44 PM  I have reviewed the triage vital signs and the nursing notes.   HISTORY  Chief Complaint Knee Pain    HPI Kyle Wang is a 20 y.o. male presents for evaluation of left knee pain. Patient reports that he was walking in his "knee gave out of him" falling onto his injured knee. Patient reports a previous injury to the same knee and was told that he has a torn ligament. Describes pain as 9 out of 10 at this time.   Past Medical History  Diagnosis Date  . Bipolar 1 disorder (HCC)   . Asthma     There are no active problems to display for this patient.   History reviewed. No pertinent past surgical history.  Current Outpatient Rx  Name  Route  Sig  Dispense  Refill  . albuterol (PROVENTIL HFA;VENTOLIN HFA) 108 (90 Base) MCG/ACT inhaler   Inhalation   Inhale 2 puffs into the lungs every 6 (six) hours as needed for wheezing or shortness of breath.         . divalproex (DEPAKOTE ER) 500 MG 24 hr tablet   Oral   Take 500 mg by mouth 2 (two) times daily.          Marland Kitchen. escitalopram (LEXAPRO) 10 MG tablet   Oral   Take 10 mg by mouth at bedtime.         . famotidine (PEPCID) 20 MG tablet   Oral   Take 1 tablet (20 mg total) by mouth 2 (two) times daily.   60 tablet   0   . ibuprofen (ADVIL,MOTRIN) 800 MG tablet   Oral   Take 1 tablet (800 mg total) by mouth every 8 (eight) hours as needed.   30 tablet   0   . lactobacillus acidophilus (BACID) TABS tablet   Oral   Take 1 tablet by mouth daily.         Marland Kitchen. LORazepam (ATIVAN) 1 MG tablet   Oral   Take 1 mg by mouth 3 (three) times daily.         . Melatonin 3 MG TABS   Oral   Take 3 mg by mouth at bedtime.         Marland Kitchen. OLANZapine (ZYPREXA) 20 MG tablet   Oral   Take 20 mg by mouth at bedtime.         Marland Kitchen. OLANZapine (ZYPREXA) 5 MG tablet   Oral  Take 5 mg by mouth every morning.         Marland Kitchen. omeprazole (PRILOSEC) 40 MG capsule   Oral   Take 40 mg by mouth every evening.           Allergies Review of patient's allergies indicates no known allergies.  Family History  Problem Relation Age of Onset  . Adopted: Yes    Social History Social History  Substance Use Topics  . Smoking status: Current Every Day Smoker -- 2.00 packs/day    Types: Cigarettes    Last Attempt to Quit: 08/18/2013  . Smokeless tobacco: Current User  . Alcohol Use: Yes     Comment: 1/5th of brandy per month. drank 8oz of beer today.    Review of Systems Constitutional: No fever/chills Cardiovascular: Denies chest pain. Respiratory: Denies shortness of breath. Musculoskeletal: Positive for left knee pain. Skin: Negative for rash. Neurological: Negative for headaches, focal weakness or numbness.  10-point ROS  otherwise negative.  ____________________________________________   PHYSICAL EXAM:  VITAL SIGNS: ED Triage Vitals  Enc Vitals Group     BP 05/30/16 1925 150/80 mmHg     Pulse Rate 05/30/16 1925 78     Resp 05/30/16 1925 18     Temp 05/30/16 1925 98.7 F (37.1 C)     Temp Source 05/30/16 1925 Oral     SpO2 05/30/16 1925 97 %     Weight 05/30/16 1925 216 lb (97.977 kg)     Height 05/30/16 1925  (1.727 m)     Head Cir --      Peak Flow --      Pain Score 05/30/16 1926 9     Pain Loc --      Pain Edu? --      Excl. in GC? --     Constitutional: Alert and oriented. Well appearing and in no acute distress. Cardiovascular: Normal rate, regular rhythm. Grossly normal heart sounds.  Good peripheral circulation. Respiratory: Normal respiratory effort.  No retractions. Lungs CTAB. Musculoskeletal:  Neurologic:  Normal speech and language. No gross focal neurologic deficits are appreciated. No gait instability. Skin:  Skin is warm, dry and intact. No rash noted. Psychiatric: Mood and affect are normal. Speech and behavior are  normal.  ____________________________________________   LABS (all labs ordered are listed, but only abnormal results are displayed)  Labs Reviewed - No data to display ____________________________________________  EKG   ____________________________________________  RADIOLOGY  FINDINGS: No evidence of fracture, dislocation, or joint effusion. No evidence of arthropathy or other focal bone abnormality. Soft tissues are unremarkable.  IMPRESSION: Normal exam. ____________________________________________   PROCEDURES  Procedure(s) performed: None  Critical Care performed: No  ____________________________________________   INITIAL IMPRESSION / ASSESSMENT AND PLAN / ED COURSE  Pertinent labs & imaging results that were available during my care of the patient were reviewed by me and considered in my medical decision making (see chart for details).  Acute left knee contusion/strain. We'll refer to orthopedics on call. Rx given for ibuprofen 800 mg 3 times a day #15. ____________________________________________   FINAL CLINICAL IMPRESSION(S) / ED DIAGNOSES  Final diagnoses:  Knee contusion, left, initial encounter  Knee strain, left, initial encounter     This chart was dictated using voice recognition software/Dragon. Despite best efforts to proofread, errors can occur which can change the meaning. Any change was purely unintentional.   Evangeline Dakin, PA-C 05/30/16 2006  Myrna Blazer, MD 05/30/16 [phone]

## 2016-05-31 ENCOUNTER — Emergency Department
Admission: EM | Admit: 2016-05-31 | Discharge: 2016-06-01 | Disposition: A | Discharge status: Other Acute Inpatient Hospital | Payer: Medicaid Other | Attending: Emergency Medicine | Admitting: Emergency Medicine

## 2016-05-31 ENCOUNTER — Encounter: Payer: Self-pay | Admitting: Emergency Medicine

## 2016-05-31 ENCOUNTER — Emergency Department: Payer: Medicaid Other

## 2016-05-31 DIAGNOSIS — Z79899 Other long term (current) drug therapy: Secondary | ICD-10-CM | POA: Insufficient documentation

## 2016-05-31 DIAGNOSIS — Z791 Long term (current) use of non-steroidal anti-inflammatories (NSAID): Secondary | ICD-10-CM | POA: Insufficient documentation

## 2016-05-31 DIAGNOSIS — R45851 Suicidal ideations: Secondary | ICD-10-CM

## 2016-05-31 DIAGNOSIS — F17210 Nicotine dependence, cigarettes, uncomplicated: Secondary | ICD-10-CM | POA: Diagnosis not present

## 2016-05-31 DIAGNOSIS — J45909 Unspecified asthma, uncomplicated: Secondary | ICD-10-CM | POA: Diagnosis not present

## 2016-05-31 DIAGNOSIS — F319 Bipolar disorder, unspecified: Secondary | ICD-10-CM | POA: Insufficient documentation

## 2016-05-31 DIAGNOSIS — Z046 Encounter for general psychiatric examination, requested by authority: Secondary | ICD-10-CM | POA: Diagnosis present

## 2016-05-31 DIAGNOSIS — F609 Personality disorder, unspecified: Secondary | ICD-10-CM

## 2016-05-31 DIAGNOSIS — F3160 Bipolar disorder, current episode mixed, unspecified: Secondary | ICD-10-CM | POA: Diagnosis not present

## 2016-05-31 LAB — CBC WITH DIFFERENTIAL/PLATELET
BASOS PCT: 0 %
Basophils Absolute: 0 10*3/uL (ref 0–0.1)
EOS ABS: 0.1 10*3/uL (ref 0–0.7)
Eosinophils Relative: 1 %
HCT: 46.4 % (ref 40.0–52.0)
Hemoglobin: 15.8 g/dL (ref 13.0–18.0)
Lymphocytes Relative: 38 %
Lymphs Abs: 4.2 10*3/uL — ABNORMAL HIGH (ref 1.0–3.6)
MCH: 30.8 pg (ref 26.0–34.0)
MCHC: 34.2 g/dL (ref 32.0–36.0)
MCV: 90.1 fL (ref 80.0–100.0)
MONO ABS: 0.8 10*3/uL (ref 0.2–1.0)
MONOS PCT: 7 %
Neutro Abs: 5.9 10*3/uL (ref 1.4–6.5)
Neutrophils Relative %: 54 %
Platelets: 215 10*3/uL (ref 150–440)
RBC: 5.14 MIL/uL (ref 4.40–5.90)
RDW: 13.5 % (ref 11.5–14.5)
WBC: 11 10*3/uL — ABNORMAL HIGH (ref 3.8–10.6)

## 2016-05-31 LAB — URINALYSIS COMPLETE WITH MICROSCOPIC (ARMC ONLY)
BILIRUBIN URINE: NEGATIVE
Bacteria, UA: NONE SEEN
GLUCOSE, UA: NEGATIVE mg/dL
HGB URINE DIPSTICK: NEGATIVE
LEUKOCYTES UA: NEGATIVE
NITRITE: NEGATIVE
Protein, ur: NEGATIVE mg/dL
SQUAMOUS EPITHELIAL / LPF: NONE SEEN
Specific Gravity, Urine: 1.01 (ref 1.005–1.030)
pH: 6 (ref 5.0–8.0)

## 2016-05-31 LAB — COMPREHENSIVE METABOLIC PANEL
ALBUMIN: 4.8 g/dL (ref 3.5–5.0)
ALT: 44 U/L (ref 17–63)
ANION GAP: 9 (ref 5–15)
AST: 26 U/L (ref 15–41)
Alkaline Phosphatase: 49 U/L (ref 38–126)
BILIRUBIN TOTAL: 0.8 mg/dL (ref 0.3–1.2)
BUN: 10 mg/dL (ref 6–20)
CHLORIDE: 102 mmol/L (ref 101–111)
CO2: 25 mmol/L (ref 22–32)
Calcium: 10.1 mg/dL (ref 8.9–10.3)
Creatinine, Ser: 0.67 mg/dL (ref 0.61–1.24)
GFR calc Af Amer: >60 mL/min (ref >60)
Glucose, Bld: 93 mg/dL (ref 65–99)
Potassium: 3.9 mmol/L (ref 3.5–5.1)
Sodium: 136 mmol/L (ref 135–145)
TOTAL PROTEIN: 8 g/dL (ref 6.5–8.1)

## 2016-05-31 LAB — URINE DRUG SCREEN, QUALITATIVE (ARMC ONLY)
Amphetamines, Ur Screen: NOT DETECTED
BARBITURATES, UR SCREEN: NOT DETECTED
BENZODIAZEPINE, UR SCRN: POSITIVE — AB
Cannabinoid 50 Ng, Ur ~~LOC~~: NOT DETECTED
Cocaine Metabolite,Ur ~~LOC~~: NOT DETECTED
MDMA (Ecstasy)Ur Screen: NOT DETECTED
METHADONE SCREEN, URINE: NOT DETECTED
OPIATE, UR SCREEN: NOT DETECTED
Phencyclidine (PCP) Ur S: NOT DETECTED
TRICYCLIC, UR SCREEN: NOT DETECTED

## 2016-05-31 LAB — ETHANOL

## 2016-05-31 MED ORDER — ALBUTEROL SULFATE HFA 108 (90 BASE) MCG/ACT IN AERS: 2.0000 | INHALATION_SPRAY | RESPIRATORY_TRACT | Status: DC | PRN

## 2016-05-31 MED ORDER — PANTOPRAZOLE SODIUM 40 MG PO TBEC
40.0000 mg | DELAYED_RELEASE_TABLET | Freq: Every day | ORAL | Status: DC
  Administered 2016-06-01: 40 mg via ORAL
  Filled 2016-05-31: qty 1

## 2016-05-31 MED ORDER — OLANZAPINE 10 MG PO TABS
20.0000 mg | ORAL_TABLET | Freq: Every day | ORAL | Status: DC
  Administered 2016-06-01: 20 mg via ORAL
  Filled 2016-05-31: qty 2

## 2016-05-31 MED ORDER — ESCITALOPRAM OXALATE 10 MG PO TABS
10.0000 mg | ORAL_TABLET | Freq: Every day | ORAL | Status: DC
  Administered 2016-06-01: 10 mg via ORAL
  Filled 2016-05-31: qty 1

## 2016-05-31 MED ORDER — DIVALPROEX SODIUM 500 MG PO DR TAB
500.0000 mg | DELAYED_RELEASE_TABLET | Freq: Two times a day (BID) | ORAL | Status: DC
  Administered 2016-06-01 (×2): 500 mg via ORAL
  Filled 2016-05-31 (×2): qty 1

## 2016-05-31 MED ORDER — OLANZAPINE 5 MG PO TABS
5.0000 mg | ORAL_TABLET | Freq: Every day | ORAL | Status: DC
  Administered 2016-06-01: 5 mg via ORAL
  Filled 2016-05-31 (×2): qty 1

## 2016-05-31 MED ORDER — FAMOTIDINE 20 MG PO TABS
10.0000 mg | ORAL_TABLET | Freq: Every day | ORAL | Status: DC
  Administered 2016-06-01: 10 mg via ORAL
  Filled 2016-05-31: qty 1

## 2016-05-31 NOTE — ED Provider Notes (Signed)
[facility] Emergency Department Provider Note  ____________________________________________  Time seen: Approximately 9:52 PM  I have reviewed the triage vital signs and the nursing notes.   HISTORY  Chief Complaint Psychiatric Evaluation   HPI Kyle Wang is a 20 y.o. male history of bipolar and asthma who presents for evaluation of suicidal ideation. Patient is on Depakote and Zyprexa. He reports that he has been on these medications since he was 20 years old. Doses were last adjusted 6 months to a year ago. Last saw his psychiatrist 2 months ago. He is seen at [facility]. He reports he has been having worsening depressive feelings for the last month. 2 or 3 days of passive suicidal ideation. Today he told the manager of his group home that he had suicidal ideation and his manager called mobile crisis. He denies plan. No homicidal ideation, no hallucinations. He endorses compliances with his medications. Patient has had multiple prior suicide attempts including shooting himself, hanging himself, stabbing himself. Has had multiple prior psychiatric admissions. Patient has no other complaints at this time and denies chest pain, shortness of breath, fever, abdominal pain, nausea, vomiting, wheezing.  Past Medical History  Diagnosis Date  . Bipolar 1 disorder (HCC)   . Asthma     There are no active problems to display for this patient.   History reviewed. No pertinent past surgical history.  Current Outpatient Rx  Name  Route  Sig  Dispense  Refill  . albuterol (PROVENTIL HFA;VENTOLIN HFA) 108 (90 Base) MCG/ACT inhaler   Inhalation   Inhale 2 puffs into the lungs every 6 (six) hours as needed for wheezing or shortness of breath.         . divalproex (DEPAKOTE ER) 500 MG 24 hr tablet   Oral   Take 500 mg by mouth 2 (two) times daily.          Marland Kitchen escitalopram (LEXAPRO) 10 MG tablet   Oral   Take 10 mg by mouth at bedtime.         . famotidine  (PEPCID) 20 MG tablet   Oral   Take 1 tablet (20 mg total) by mouth 2 (two) times daily.   60 tablet   0   . ibuprofen (ADVIL,MOTRIN) 800 MG tablet   Oral   Take 1 tablet (800 mg total) by mouth every 8 (eight) hours as needed.   30 tablet   0   . lactobacillus acidophilus (BACID) TABS tablet   Oral   Take 1 tablet by mouth daily.         Marland Kitchen LORazepam (ATIVAN) 1 MG tablet   Oral   Take 1 mg by mouth 3 (three) times daily.         . Melatonin 3 MG TABS   Oral   Take 3 mg by mouth at bedtime.         Marland Kitchen OLANZapine (ZYPREXA) 20 MG tablet   Oral   Take 20 mg by mouth at bedtime.         Marland Kitchen OLANZapine (ZYPREXA) 5 MG tablet   Oral   Take 5 mg by mouth every morning.         Marland Kitchen omeprazole (PRILOSEC) 40 MG capsule   Oral   Take 40 mg by mouth every evening.           Allergies Review of patient's allergies indicates no known allergies.  Family History  Problem Relation Age of Onset  . Adopted: Yes  Social History Social History  Substance Use Topics  . Smoking status: Current Every Day Smoker -- 2.00 packs/day    Types: Cigarettes    Last Attempt to Quit: 08/18/2013  . Smokeless tobacco: Current User  . Alcohol Use: Yes     Comment: 1/5th of brandy per month. drank 8oz of beer today.    Review of Systems  Constitutional: Negative for fever. Eyes: Negative for visual changes. ENT: Negative for sore throat. Cardiovascular: Negative for chest pain. Respiratory: Negative for shortness of breath. Gastrointestinal: Negative for abdominal pain, vomiting or diarrhea. Genitourinary: Negative for dysuria. Musculoskeletal: Negative for back pain. Skin: Negative for rash. Neurological: Negative for headaches, weakness or numbness. Psych: Positive for passive +, no HI, no hallucinations  ____________________________________________   PHYSICAL EXAM:  VITAL SIGNS: ED Triage Vitals  Enc Vitals Group     BP 05/31/16 2006 124/84 mmHg     Pulse Rate  05/31/16 2006 60     Resp 05/31/16 2006 18     Temp 05/31/16 2006 98.6 F (37 C)     Temp Source 05/31/16 2006 Oral     SpO2 05/31/16 2006 96 %     Weight 05/31/16 2006 216 lb (97.977 kg)     Height 05/31/16 2006  (1.727 m)     Head Cir --      Peak Flow --      Pain Score 05/31/16 2007 4     Pain Loc --      Pain Edu? --      Excl. in GC? --     Constitutional: Alert and oriented. Well appearing and in no apparent distress. HEENT:      Head: Normocephalic and atraumatic.         Eyes: Conjunctivae are normal. Sclera is non-icteric. EOMI. PERRL      Mouth/Throat: Mucous membranes are moist.       Neck: Supple with no signs of meningismus. Cardiovascular: Regular rate and rhythm. No murmurs, gallops, or rubs. 2+ symmetrical distal pulses are present in all extremities. No JVD. Respiratory: Normal respiratory effort. Lungs are clear to auscultation bilaterally. No wheezes, crackles, or rhonchi.  Gastrointestinal: Soft, non tender, and non distended with positive bowel sounds. No rebound or guarding. Neurologic: Normal speech and language. Face is symmetric. Moving all extremities. No gross focal neurologic deficits are appreciated. Skin: Skin is warm, dry and intact. No rash noted. Psychiatric: Mood and affect are normal. Speech and behavior are normal. SI with no plan. No HI  ____________________________________________   LABS (all labs ordered are listed, but only abnormal results are displayed)  Labs Reviewed  CBC WITH DIFFERENTIAL/PLATELET - Abnormal; Notable for the following:    WBC 11.0 (*)    Lymphs Abs 4.2 (*)    All other components within normal limits  URINE DRUG SCREEN, QUALITATIVE ([facility] ONLY) - Abnormal; Notable for the following:    Benzodiazepine, Ur Scrn POSITIVE (*)    All other components within normal limits  URINALYSIS COMPLETEWITH MICROSCOPIC ([facility] ONLY) - Abnormal; Notable for the following:    Color, Urine YELLOW (*)    APPearance CLEAR (*)     Ketones, ur TRACE (*)    All other components within normal limits  COMPREHENSIVE METABOLIC PANEL  ETHANOL   ____________________________________________  EKG  none  ____________________________________________  RADIOLOGY  CXR: negative  ____________________________________________   PROCEDURES  Procedure(s) performed: None Critical Care performed:  None ____________________________________________   INITIAL IMPRESSION / ASSESSMENT AND PLAN / ED  COURSE   20 y.o. male history of bipolar and asthma who presents for evaluation of suicidal ideation in the setting of worsening depression for one month. No current plan. Multiple prior suicide attempts. Patient arrives IVC. We'll medically clear and consult psychiatry.  ----------------------------------------- 11:40 PM on 05/31/2016 ----------------------------------------- Patient medically cleared will be admitted by psychiatry.   Pertinent labs & imaging results that were available during my care of the patient were reviewed by me and considered in my medical decision making (see chart for details).    ____________________________________________   FINAL CLINICAL IMPRESSION(S) / ED DIAGNOSES  Final diagnoses:  Suicidal ideation      NEW MEDICATIONS STARTED DURING THIS VISIT:  New Prescriptions   No medications on file     Note:  This document was prepared using Dragon voice recognition software and may include unintentional dictation errors.    Nita Sicklearolina Ples Trudel, MD 05/31/16 [phone]
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
## 2016-05-31 NOTE — ED Notes (Signed)
Pt arrived to the ED via [facility] from [facility] after being IVC'd for suicidal ideations. Pt states that he felt like a manic episode was about to come on and decided to go to [facility] and try to get admitted to the hospital for evaluation and medication adjustment. Pt states that he feels like he is on top of the world and is untouchable. Pt reports that he has SI but has no plan. Pt has attempted suicide multiple times in the past. Pt is AOx4 in no apparent distress, cooperative during triage.

## 2016-05-31 NOTE — ED Notes (Addendum)
Pt presents to ED under IVC via [facility] for expressing SI (without plan). Pt states the thoughts of killing himself are caused by "everyday stress"; pt states everyday stressors include "being away from my family, not being able to make friends, stuff like that". Pt is A&Ox4, in NAD, with respirations even, regular and unlabored.

## 2016-06-01 ENCOUNTER — Encounter: Payer: Self-pay | Admitting: *Deleted

## 2016-06-01 ENCOUNTER — Inpatient Hospital Stay
Admission: EM | Admit: 2016-06-01 | Discharge: 2016-06-03 | DRG: 885 | Disposition: A | Discharge status: Home / Self Care | Payer: PRIVATE HEALTH INSURANCE | Source: Intra-hospital | Attending: Psychiatry | Admitting: Psychiatry

## 2016-06-01 DIAGNOSIS — Z818 Family history of other mental and behavioral disorders: Secondary | ICD-10-CM

## 2016-06-01 DIAGNOSIS — F17210 Nicotine dependence, cigarettes, uncomplicated: Secondary | ICD-10-CM | POA: Diagnosis present

## 2016-06-01 DIAGNOSIS — J45909 Unspecified asthma, uncomplicated: Secondary | ICD-10-CM | POA: Diagnosis present

## 2016-06-01 DIAGNOSIS — F419 Anxiety disorder, unspecified: Secondary | ICD-10-CM | POA: Diagnosis present

## 2016-06-01 DIAGNOSIS — F3160 Bipolar disorder, current episode mixed, unspecified: Secondary | ICD-10-CM | POA: Diagnosis not present

## 2016-06-01 DIAGNOSIS — R45851 Suicidal ideations: Secondary | ICD-10-CM

## 2016-06-01 DIAGNOSIS — F609 Personality disorder, unspecified: Secondary | ICD-10-CM

## 2016-06-01 DIAGNOSIS — F17200 Nicotine dependence, unspecified, uncomplicated: Secondary | ICD-10-CM

## 2016-06-01 DIAGNOSIS — K219 Gastro-esophageal reflux disease without esophagitis: Secondary | ICD-10-CM | POA: Diagnosis present

## 2016-06-01 DIAGNOSIS — F319 Bipolar disorder, unspecified: Principal | ICD-10-CM | POA: Diagnosis present

## 2016-06-01 DIAGNOSIS — Z79899 Other long term (current) drug therapy: Secondary | ICD-10-CM | POA: Diagnosis not present

## 2016-06-01 DIAGNOSIS — F3130 Bipolar disorder, current episode depressed, mild or moderate severity, unspecified: Secondary | ICD-10-CM | POA: Diagnosis not present

## 2016-06-01 MED ORDER — OLANZAPINE 10 MG PO TABS
20.0000 mg | ORAL_TABLET | Freq: Every day | ORAL | Status: DC
  Administered 2016-06-01 – 2016-06-02 (×2): 20 mg via ORAL
  Filled 2016-06-01 (×2): qty 2

## 2016-06-01 MED ORDER — ALUM & MAG HYDROXIDE-SIMETH 200-200-20 MG/5ML PO SUSP: 30.0000 mL | ORAL | Status: DC | PRN

## 2016-06-01 MED ORDER — OLANZAPINE 10 MG PO TABS
5.0000 mg | ORAL_TABLET | Freq: Every day | ORAL | Status: DC
  Administered 2016-06-02 – 2016-06-03 (×2): 5 mg via ORAL
  Filled 2016-06-01 (×2): qty 1

## 2016-06-01 MED ORDER — IBUPROFEN 800 MG PO TABS: 800.0000 mg | ORAL_TABLET | Freq: Four times a day (QID) | ORAL | Status: DC | PRN

## 2016-06-01 MED ORDER — PANTOPRAZOLE SODIUM 40 MG PO TBEC
40.0000 mg | DELAYED_RELEASE_TABLET | Freq: Every day | ORAL | Status: DC
  Administered 2016-06-02 – 2016-06-03 (×2): 40 mg via ORAL
  Filled 2016-06-01 (×2): qty 1

## 2016-06-01 MED ORDER — HYDROXYZINE HCL 50 MG PO TABS
50.0000 mg | ORAL_TABLET | Freq: Four times a day (QID) | ORAL | Status: DC | PRN
  Administered 2016-06-01: 50 mg via ORAL
  Filled 2016-06-01: qty 1

## 2016-06-01 MED ORDER — DIVALPROEX SODIUM 500 MG PO DR TAB
500.0000 mg | DELAYED_RELEASE_TABLET | Freq: Two times a day (BID) | ORAL | Status: DC
  Administered 2016-06-01 – 2016-06-03 (×4): 500 mg via ORAL
  Filled 2016-06-01 (×4): qty 1

## 2016-06-01 MED ORDER — MAGNESIUM HYDROXIDE 400 MG/5ML PO SUSP: 30.0000 mL | Freq: Every day | ORAL | Status: DC | PRN

## 2016-06-01 MED ORDER — IBUPROFEN 800 MG PO TABS
800.0000 mg | ORAL_TABLET | Freq: Three times a day (TID) | ORAL | Status: DC | PRN
  Administered 2016-06-01: 800 mg via ORAL
  Filled 2016-06-01: qty 1

## 2016-06-01 MED ORDER — ESCITALOPRAM OXALATE 10 MG PO TABS
10.0000 mg | ORAL_TABLET | Freq: Every day | ORAL | Status: DC
  Administered 2016-06-02: 10 mg via ORAL
  Filled 2016-06-01: qty 1

## 2016-06-01 MED ORDER — ALBUTEROL SULFATE HFA 108 (90 BASE) MCG/ACT IN AERS
2.0000 | INHALATION_SPRAY | RESPIRATORY_TRACT | Status: DC | PRN
  Filled 2016-06-01: qty 6.7

## 2016-06-01 MED ORDER — IBUPROFEN 600 MG PO TABS
600.0000 mg | ORAL_TABLET | Freq: Four times a day (QID) | ORAL | Status: DC | PRN
  Administered 2016-06-02: 600 mg via ORAL
  Filled 2016-06-01: qty 1

## 2016-06-01 MED ORDER — ACETAMINOPHEN 325 MG PO TABS
650.0000 mg | ORAL_TABLET | Freq: Four times a day (QID) | ORAL | Status: DC | PRN
  Administered 2016-06-01: 650 mg via ORAL
  Filled 2016-06-01: qty 2

## 2016-06-01 MED ORDER — FAMOTIDINE 20 MG PO TABS
10.0000 mg | ORAL_TABLET | Freq: Every day | ORAL | Status: DC
  Administered 2016-06-02: 10 mg via ORAL
  Filled 2016-06-01: qty 1
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
## 2016-06-01 NOTE — Progress Notes (Signed)
[facility] by Dr. Toni Amendlapacs.  Attending Physician will be Dr. Ardyth HarpsHernandez.   Patient has been [facility] to room 314, [facility] Gwyn.   ER staff is aware of the admission ( ER Sect. Rivka BarbaraGlenda; Dr. Inocencio HomesGayle, ER MD; Wille CelesteJanie Patient's Nurse & MariaPatient Access). Madie Cahn K. Sherlon HandingHarris, LCAS-A, LPC-A, [facility]  Counselor 06/01/2016 2:12 PM

## 2016-06-01 NOTE — Tx Team (Signed)
Initial Interdisciplinary Treatment Plan   PATIENT STRESSORS: Marital or family conflict Medication change or noncompliance   PATIENT STRENGTHS: Average or above average intelligence General fund of knowledge Physical Health Supportive family/friends   PROBLEM LIST: Problem List/Patient Goals Date to be addressed Date deferred Reason deferred Estimated date of resolution  "I need a medication adjustment." 06/01/2016     "I was having suicidal thoughts" 06/01/2016     Suicidal Ideation 06/01/2016     Hx of substance abuse 06/01/2016     Depression 06/01/2016                              DISCHARGE CRITERIA:  Improved stabilization in mood, thinking, and/or behavior Motivation to continue treatment in a less acute level of care Reduction of life-threatening or endangering symptoms to within safe limits  PRELIMINARY DISCHARGE PLAN: Outpatient therapy Return to previous living arrangement  PATIENT/FAMIILY INVOLVEMENT: This treatment plan has been presented to and reviewed with the patient, Jerald KiefBrandon Farooqui.  The patient and family have been given the opportunity to ask questions and make suggestions.  Cranford MonBeaudry, Icesis Renn Evans 06/01/2016, 5:43 PM

## 2016-06-02 DIAGNOSIS — K219 Gastro-esophageal reflux disease without esophagitis: Secondary | ICD-10-CM

## 2016-06-02 DIAGNOSIS — F17200 Nicotine dependence, unspecified, uncomplicated: Secondary | ICD-10-CM

## 2016-06-02 DIAGNOSIS — J45909 Unspecified asthma, uncomplicated: Secondary | ICD-10-CM

## 2016-06-02 LAB — LIPID PANEL
CHOL/HDL RATIO: 4.9 ratio
CHOLESTEROL: 158 mg/dL (ref 0–200)
HDL: 32 mg/dL — AB (ref >40)
LDL Cholesterol: 73 mg/dL (ref 0–99)
TRIGLYCERIDES: 264 mg/dL — AB (ref <150)
VLDL: 53 mg/dL — AB (ref 0–40)

## 2016-06-02 LAB — HEMOGLOBIN A1C: Hgb A1c MFr Bld: 5.7 % (ref 4.0–6.0)

## 2016-06-02 LAB — TSH: TSH: 3.566 u[IU]/mL (ref 0.350–4.500)

## 2016-06-02 MED ORDER — ESCITALOPRAM OXALATE 20 MG PO TABS: 20.0000 mg | ORAL_TABLET | Freq: Every day | ORAL | Status: DC

## 2016-06-02 MED ORDER — IBUPROFEN 800 MG PO TABS
800.0000 mg | ORAL_TABLET | Freq: Four times a day (QID) | ORAL | Status: DC | PRN
  Administered 2016-06-02 – 2016-06-03 (×2): 800 mg via ORAL
  Filled 2016-06-02 (×2): qty 1

## 2016-06-02 MED ORDER — NICOTINE 21 MG/24HR TD PT24
21.0000 mg | MEDICATED_PATCH | Freq: Every day | TRANSDERMAL | Status: DC
  Administered 2016-06-02 – 2016-06-03 (×2): 21 mg via TRANSDERMAL
  Filled 2016-06-02 (×2): qty 1

## 2016-06-02 MED ORDER — ESCITALOPRAM OXALATE 10 MG PO TABS
20.0000 mg | ORAL_TABLET | Freq: Every day | ORAL | Status: DC
  Administered 2016-06-03: 20 mg via ORAL
  Filled 2016-06-02: qty 2

## 2016-06-02 MED ORDER — CLONAZEPAM 0.5 MG PO TABS
0.5000 mg | ORAL_TABLET | Freq: Three times a day (TID) | ORAL | Status: DC
Start: 2016-06-02 — End: 2016-06-03
  Administered 2016-06-02 – 2016-06-03 (×3): 0.5 mg via ORAL
  Filled 2016-06-02 (×3): qty 1

## 2016-06-02 MED ORDER — CLONAZEPAM 0.5 MG PO TABS: 0.5000 mg | ORAL_TABLET | Freq: Three times a day (TID) | ORAL | Status: DC

## 2016-06-02 MED ORDER — ESCITALOPRAM OXALATE 10 MG PO TABS
10.0000 mg | ORAL_TABLET | Freq: Once | ORAL | Status: AC
  Administered 2016-06-02: 10 mg via ORAL
  Filled 2016-06-02: qty 1
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
## 2016-06-02 NOTE — [facility] Suicide Risk Assessment (Signed)
[facility]   Principal Problem: Bipolar disorder [name](HCC) Discharge Diagnoses:  Patient Active Problem List   Diagnosis Date Noted  . GERD (gastroesophageal reflux disease) [K21.9] 06/02/2016  . Tobacco use disorder [F17.200] 06/02/2016  . Asthma [J45.909] 06/02/2016  . Suicidal ideation [R45.851] 06/01/2016  . Personality disorder [F60.9] 06/01/2016  . Bipolar disorder (HCC) [F31.9] 06/01/2016    Psychiatric Specialty Exam: ROS  Blood pressure 127/68, pulse 61, temperature 97.8 F (36.6 C), temperature source Oral, resp. rate 18, height 5\' 8"  (1.727 m), weight 97.977 kg (216 lb).Body mass index is 32.85 kg/(m^2).                                                       Mental Status Per Nursing Assessment::   On Admission:     Demographic Factors:  Male and Caucasian  Loss Factors: NA  Historical Factors: Prior suicide attempts and Impulsivity  Risk Reduction Factors:   Sense of responsibility to family and Positive social support  Continued Clinical Symptoms:  Depression:   Impulsivity Personality Disorders:   Cluster B Comorbid depression Previous Psychiatric Diagnoses and Treatments  Cognitive Features That Contribute To Risk:  Closed-mindedness    Suicide Risk:  Minimal: No identifiable suicidal ideation.  Patients presenting with no risk factors but with morbid ruminations; may be classified as minimal risk based on the severity of the depressive symptoms  Follow-up Information    Follow up with [facility]. Go on 06/08/2016.   Why:  [facility] Wednesday, July 19th for your hospital follow-up. We ask that you arrive at 7am for prompt services. If you have any questions or concerns you may contact [facility] or [name] at [phone]   Contact information:    [address], [place], [place] [address] Hours: 9AM-5PM Phone: [phone]       [name],  [name], MD 06/03/2016,  9:33 AM

## 2016-06-02 NOTE — Progress Notes (Signed)
D: Patient is alert and oriented on the unit this shift. Patient attended   in groups today. Patient denies suicidal ideation, homicidal ideation, auditory or visual hallucinations at the present time.  A: Scheduled medications are administered to patient as per MD orders. Emotional support and encouragement are provided. Patient is maintained on q.15 minute safety checks. Patient is informed to notify staff with questions or concerns. R: No adverse medication reactions are noted. Patient is cooperative with medication administration and treatment plan today. Patient is receptive,  anxious and cooperative on the unit at this time. Patient interacts well with others on the unit this shift. Patient contracts for safety at this time. Patient remains safe at this time. Anxiety 5/10, depression 2/10

## 2016-06-02 NOTE — Plan of Care (Signed)
Problem: Coping: Goal: Ability to cope will improve Outcome: Not Progressing Pt not able to use coping skills as of yet encouraged him to go to group daily [place] RN

## 2016-06-02 NOTE — BHH Group Notes (Signed)
BHH Group Notes:  (Nursing/MHT/Case Management/Adjunct)  Date:  06/02/2016  Time:  4:03 PM  Type of Therapy:  Psychoeducational Skills  Participation Level:  Active  Participation Quality:  Appropriate, Attentive and Sharing  Affect:  Appropriate  Cognitive:  Appropriate  Insight:  Appropriate  Engagement in Group:  Engaged  Modes of Intervention:  Discussion, Education and Support  Summary of Progress/Problems:  Kyle Wang 06/02/2016, 4:03 PM

## 2016-06-02 NOTE — Plan of Care (Signed)
Problem: Spiritual Needs Goal: Ability to function at adequate level Outcome: Progressing Provides self care, no re-direction needed

## 2016-06-02 NOTE — BHH Suicide Risk Assessment (Signed)
B[facility] INPATIENT:  Family/Significant Other Suicide Prevention Education  Suicide Prevention Education:  Education Completed; Mother/Legal guardian, Reece LeaderRosemarie Perazzo ph #: [phone] has been identified by the patient as the family member/significant other with whom the patient will be residing, and identified as the person(s) who will aid the patient in the event of a mental health crisis (suicidal ideations/suicide attempt).  With written consent from the patient, the family member/significant other has been provided the following suicide prevention education, prior to the and/or following the discharge of the patient.  The suicide prevention education provided includes the following:  Suicide risk factors  Suicide prevention and interventions  National Suicide Hotline telephone number  [facility] assessment telephone number  [facility] Emergency Assistance 911  [facility] and/or Residential Mobile Crisis Unit telephone number  Request made of family/significant other to:  Remove weapons (e.g., guns, rifles, knives), all items previously/currently identified as safety concern.    Remove drugs/medications (over-the-counter, prescriptions, illicit drugs), all items previously/currently identified as a safety concern.  The family member/significant other verbalizes understanding of the suicide prevention education information provided.  The family member/significant other agrees to remove the items of safety concern listed above.  Lynden OxfordKadijah R Aleda Madl, LCSW-A 06/02/2016, 10:14 AM

## 2016-06-02 NOTE — BHH Group Notes (Signed)
Goals Group  Date/Time: 06/02/16 9am  Type of Therapy and Topic: Group Therapy: Goals Group: SMART Goals   Pt was called, but did not attend   Rockwell Zentz F. Mckenize Mezera, LCSWA, LCAS  

## 2016-06-02 NOTE — B[facility] Group Notes (Signed)
B[facility] LCSW Group Therapy   06/02/2016 9:15 am   Type of Therapy: Group Therapy   Participation Level: Active   Participation Quality: Attentive, Sharing and Supportive   Affect: Appropriate   Cognitive: Alert and Oriented   Insight: Developing/Improving and Engaged   Engagement in Therapy: Developing/Improving and Engaged   Modes of Intervention: Clarification, Confrontation, Discussion, Education, Exploration, Limit-setting, Orientation, Problem-solving, Rapport Building, Dance movement psychotherapisteality Testing, Socialization and Support   Summary of Progress/Problems: The topic for group was balance in life. Today's group focused on defining balance in one's own words, identifying things that can knock one off balance, and exploring healthy ways to maintain balance in life. Group members were asked to provide an example of a time when they felt off balance, describe how they handled that situation, and process healthier ways to regain balance in the future. Group members were asked to share the most important tool for maintaining balance that they learned [facility] at [facility] and how they plan to apply this method after discharge. Pt shared the pt feels off balance when he is hospitalized and hopes to learn more coping skills.  Pt shared the pt hopes to "get better and better" so he can return to his group home. Pt shared the pt's most important tool is to "control myself". Pt reports the pt intends to do this by taking his medications.
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
## 2016-06-02 NOTE — Progress Notes (Signed)
Pleasant and cooperative with care. Noted smiling and laughing with peers. Denies SI, HI, AVH. No negative behaviors. Med and group compliant. C/o pain early morning to left leg. Prn given with good relief. Encouragement and support offered. Pt receptive and remains safe on unit with q 15 min checks.
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
## 2016-06-02 NOTE — B[facility] Group Notes (Signed)
[facility] Group Notes:  (Nursing/MHT/Case Management/Adjunct)  Date:  06/02/2016  Time:  3:27 AM  Type of Therapy:  Psychoeducational Skills  Participation Level:  Active  Participation Quality:  Appropriate  Affect:  Appropriate  Cognitive:  Appropriate  Insight:  Appropriate and Good  Engagement in Group:  Engaged  Modes of Intervention:  Discussion, Socialization and Support  Summary of Progress/Problems:  Kyle Wang 06/02/2016, 3:27 AM

## 2016-06-02 NOTE — B[facility] Group Notes (Signed)
Goals Group  Date/Time: 06/02/16 9am  Type of Therapy and Topic: Group Therapy: Goals Group: SMART Goals   Pt was called, but did not attend   Leniya Breit F. Selma Rodelo, LCSWA, LCAS  

## 2016-06-02 NOTE — Progress Notes (Signed)
Recreation Therapy Notes  Date: 07.13.17 Time: 9:30 am Location: Craft Room  Group Topic: Leisure Education  Goal Area(s) Addresses:  Patient will identify activities for each letter of the alphabet. Patient will verbalize ability to use leisure as a Associate Professorcoping skill.  Behavioral Response: Did not attend  Intervention: Leisure Alphabet  Activity: Patients were given a Leisure Information systems managerAlphabet worksheet and instructed to identify a leisure activity for each letter of the alphabet.   Education: LRT educated patients on what they need to participate in leisure.  Education Outcome: Patient did not attend group.  Clinical Observations/Feedback: Patient did not attend group.  Jacquelynn CreeGreene,Henslee Lottman M, LRT/CTRS 06/02/2016 10:15 AM

## 2016-06-03 DIAGNOSIS — F3130 Bipolar disorder, current episode depressed, mild or moderate severity, unspecified: Secondary | ICD-10-CM

## 2016-06-03 LAB — VALPROIC ACID LEVEL: Valproic Acid Lvl: 75 ug/mL (ref 50.0–100.0)

## 2016-06-03 LAB — PROLACTIN: PROLACTIN: 37.5 ng/mL — AB (ref 4.0–15.2)

## 2016-06-03 NOTE — B[facility] Group Notes (Signed)
[facility] Group Notes:  (Nursing/MHT/Case Management/Adjunct)  Date:  06/03/2016  Time:  2:27 AM  Type of Therapy:  Group Therapy  Participation Level:  Active  Participation Quality:  Appropriate  Affect:  Appropriate  Cognitive:  Appropriate  Insight:  Appropriate  Engagement in Group:  Engaged  Modes of Intervention:  Discussion  Summary of Progress/Problems: Pt stated that his medication change has been very effective. He said that he usually has happy demeanor but is sad on the inside. He felt that today was the first day he did not feel that way.   Fanny Skatesshley Imani Sianni Cloninger 06/03/2016, 2:27 AM

## 2016-06-03 NOTE — [place] FL2 (Signed)
[place] MEDICAID FL2 LEVEL OF CARE SCREENING TOOL     IDENTIFICATION  Patient Name: Kyle Wang Birthdate: [birth year] Sex: male Admission Date (Current Location): 06/01/2016  [place] and [place] Number:  [address] [id number] T Facility and Address:  [facility], [address], [place], [place] [address]      Provider Number: [id number]  Attending Physician Name and Address:  Barnabas Harries*  Relative Name and Phone Number:  Taichi Repka [phone]    Current Level of Care: Hospital Recommended Level of Care: [facility] Prior Approval Number:    Date Approved/Denied:   PASRR Number:    Discharge Plan: Other (Comment) (Group home)    Current Diagnoses: Patient Active Problem List   Diagnosis Date Noted  . GERD (gastroesophageal reflux disease) 06/02/2016  . Tobacco use disorder 06/02/2016  . Asthma 06/02/2016  . Suicidal ideation 06/01/2016  . Personality disorder 06/01/2016  . Bipolar disorder (HCC) 06/01/2016    Orientation RESPIRATION BLADDER Height & Weight     Self, Time, Situation, Place  Normal Continent Weight: 216 lb (97.977 kg) Height:   (172.7 cm)  BEHAVIORAL SYMPTOMS/MOOD NEUROLOGICAL BOWEL NUTRITION STATUS      Continent    AMBULATORY STATUS COMMUNICATION OF NEEDS Skin   Independent Verbally Normal                       Personal Care Assistance Level of Assistance              Functional Limitations Info             SPECIAL CARE FACTORS FREQUENCY                       Contractures Contractures Info: Not present    Additional Factors Info                  Current Medications (06/03/2016):  This is the current hospital active medication list Current Facility-Administered Medications  Medication Dose Route Frequency Provider Last Rate Last Dose  . albuterol (PROVENTIL HFA;VENTOLIN HFA) 108 (90 Base) MCG/ACT inhaler 2 puff  2 puff Inhalation Q4H PRN Audery Amel, MD      . alum & mag hydroxide-simeth (MAALOX/MYLANTA) 200-200-20 MG/5ML suspension 30 mL  30 mL Oral Q4H PRN Audery Amel, MD      . clonazePAM Scarlette Calico) tablet 0.5 mg  0.5 mg Oral TID Jimmy Footman, MD   0.5 mg at 06/02/16 2123  . divalproex (DEPAKOTE) DR tablet 500 mg  500 mg Oral Q12H Audery Amel, MD   500 mg at 06/02/16 2123  . escitalopram (LEXAPRO) tablet 20 mg  20 mg Oral Daily Jimmy Footman, MD      . hydrOXYzine (ATARAX/VISTARIL) tablet 50 mg  50 mg Oral Q6H PRN Audery Amel, MD   50 mg at 06/01/16 2329  . ibuprofen (ADVIL,MOTRIN) tablet 800 mg  800 mg Oral Q6H PRN Jimmy Footman, MD   800 mg at 06/02/16 2123  . magnesium hydroxide (MILK OF MAGNESIA) suspension 30 mL  30 mL Oral Daily PRN Audery Amel, MD      . nicotine (NICODERM CQ - dosed in mg/24 hours) patch 21 mg  21 mg Transdermal Daily Jimmy Footman, MD   21 mg at 06/02/16 1313  . OLANZapine (ZYPREXA) tablet 20 mg  20 mg Oral QHS Audery Amel, MD   20 mg at 06/02/16 2123  . OLANZapine (ZYPREXA) tablet  5 mg  5 mg Oral Daily Audery AmelJohn T Clapacs, MD   5 mg at 06/02/16 0818  . pantoprazole (PROTONIX) EC tablet 40 mg  40 mg Oral Daily Audery AmelJohn T Clapacs, MD   40 mg at 06/02/16 0818     Discharge Medications: Please see discharge summary for a list of discharge medications.  Relevant Imaging Results:  Relevant Lab Results:   Additional Information    Lynden OxfordKadijah R Rosaleen Mazer, LCSW

## 2016-06-03 NOTE — Tx Team (Signed)
Interdisciplinary Treatment Plan Update (Adult)         Date: 06/03/2016   Time Reviewed: 10:30 AM   Progress in Treatment: Improving Attending groups: Yes  Participating in groups: Yes  Taking medication as prescribed: Yes  Tolerating medication: Yes  Family/Significant other contact made: Yes, CSW has spoken with legal guardian, Bertin Inabinet Patient understands diagnosis: Yes  Discussing patient identified problems/goals with staff: Yes  Medical problems stabilized or resolved: Yes  Denies suicidal/homicidal ideation: Yes  Issues/concerns per patient self-inventory: Yes  Other:   New problem(s) identified: N/A   Discharge Plan or Barriers: see below   Reason for Continuation of Hospitalization:   Depression   Anxiety   Medication Stabilization   Comments: N/A   Discharge date: 06/03/16    Patient is a 20 year old  male admitted for suicidal ideation. Patient lives in [place], [place]. Patient will benefit from crisis stabilization, medication evaluation, group therapy, and psycho education in addition to case management for discharge planning. Patient and CSW reviewed pt's identified goals and treatment plan. Pt verbalized understanding and agreed to treatment plan.    Review of initial/current patient goals per problem list:  1. Goal(s): Patient will participate in aftercare plan   Met: Yes  Target date: 3-5 days post admission date   As evidenced by: Patient will participate within aftercare plan AEB aftercare provider and housing plan at discharge being identified.  06/03/16: Patient will f/u with [facility].   2. Goal (s): Patient will exhibit decreased depressive symptoms and suicidal ideations.   Met: Yes  Target date: 3-5 days post admission date   As evidenced by: Patient will utilize self-rating of depression at 3 or below and demonstrate decreased signs of depression or be deemed stable for discharge by MD.   06/03/16: Pt denies SI/HI.   Pt reports he is safe for discharge. Adequate for discharge per MD.   3. Goal(s): Patient will demonstrate decreased signs and symptoms of anxiety.   Met: Yes  Target date: 3-5 days post admission date   As evidenced by: Patient will utilize self-rating of anxiety at 3 or below and demonstrated decreased signs of anxiety, or be deemed stable for discharge by MD   06/03/16: Patient denies anxiety symptoms at this time. Adequate for discharge per MD.  Patient: [place] Other Family:  Physician: Merlyn Albert, MD     06/03/2016 10:30 AM  Nursing: Elige Radon , RN      06/03/2016 10:30 AM  Clinical Social Worker: Emilie Rutter, [place]  06/03/2016 10:30 AM  Other: Everitt Amber       06/03/2016 10:30 AM

## 2016-06-03 NOTE — Plan of Care (Signed)
Problem: Medication: Goal: Compliance with prescribed medication regimen will improve Outcome: Progressing Patient has been compliant with medication during this shift.    

## 2016-06-03 NOTE — [facility] Suicide Risk Assessment (Signed)
[facility] Admission Suicide Risk Assessment   Nursing information obtained from:  Patient Demographic factors:    Current Mental Status:    Loss Factors:    Historical Factors:    Risk Reduction Factors:     Total Time spent with patient: 1 hour Principal Problem: Bipolar disorder (HCC) Diagnosis:   Patient Active Problem List   Diagnosis Date Noted  . GERD (gastroesophageal reflux disease) [K21.9] 06/02/2016  . Tobacco use disorder [F17.200] 06/02/2016  . Asthma [J45.909] 06/02/2016  . Suicidal ideation [R45.851] 06/01/2016  . Personality disorder [F60.9] 06/01/2016  . Bipolar disorder (HCC) [F31.9] 06/01/2016   Subjective Data:   Continued Clinical Symptoms:  Alcohol Use Disorder Identification Test Final Score (AUDIT): 0 The "Alcohol Use Disorders Identification Test", Guidelines for Use in Primary Care, Second Edition.  World Science writerHealth Organization [facility](WHO). Score between 0-7:  no or low risk or alcohol related problems. Score between 8-15:  moderate risk of alcohol related problems. Score between 16-19:  high risk of alcohol related problems. Score 20 or above:  warrants further diagnostic evaluation for alcohol dependence and treatment.   CLINICAL FACTORS:   Personality Disorders:   Cluster B Comorbid depression Previous Psychiatric Diagnoses and Treatments    Psychiatric Specialty Exam: Physical Exam  ROS  Blood pressure 127/68, pulse 61, temperature 97.8 F (36.6 C), temperature source Oral, resp. rate 18, height 5\' 8"  (1.727 m), weight 97.977 kg (216 lb).Body mass index is 32.85 kg/(m^2).                                                    Sleep:  Number of Hours: 6.5      COGNITIVE FEATURES THAT CONTRIBUTE TO RISK:  None    SUICIDE RISK:   Mild:  Suicidal ideation of limited frequency, intensity, duration, and specificity.  There are no identifiable plans, no associated intent, mild dysphoria and related symptoms, good self-control (both  objective and subjective assessment), few other risk factors, and identifiable protective factors, including available and accessible social support.  [facility]  I certify that inpatient services furnished can reasonably be expected to improve the patient's condition.   [name],  Roosevelt Eimers, MD 06/03/2016, 9:34 AM

## 2016-06-03 NOTE — Progress Notes (Signed)
Denies SI/HI/AVH.  Affect bright.  Denies depression.   Discharge instructions reviewed with patient and group home owner.  Both verbalized understanding.  Prescriptions, FL2 given and personal belongings returned.  Care relinquished to group home owner.

## 2016-06-03 NOTE — Progress Notes (Signed)
D: Patient appears bright on the unit. He is visible in the milieu and interacts well with peers. He denies SI/HI/AVH at this time. He states he came in with SI but he's ready for discharge.  A: Medication was given with education. Encouragement was provided.  R: Patient was compliant with medication. He has remained calm and cooperative. Safety maintained with 15 min checks.

## 2016-06-03 NOTE — [facility] Group Notes (Signed)
[facility] Group Notes:  (Nursing/MHT/Case Management/Adjunct)  Date:  06/03/2016  Time:  3:41 PM  Type of Therapy:  Movement Therapy  Participation Level:  Active  Participation Quality:  Attentive  Affect:  Appropriate  Cognitive:  Alert, Appropriate and Oriented  Insight:  Appropriate  Engagement in Group:  Engaged  Modes of Intervention:  Socialization  Summary of Progress/Problems:  Kyle Wang 06/03/2016, 3:41 PM

## 2016-06-03 NOTE — Progress Notes (Signed)
Recreation Therapy Notes  Date: 07.14.17 Time: 9:30 am Location: Craft Room  Group Topic: Coping Skills  Goal Area(s) Addresses:  Patient will participate in healthy coping skill. Patient will verbalize one emotion experienced during group.  Behavioral Response: Arrived late, Attentive, Interactive  Intervention: Coloring  Activity: Patients were given coloring sheets and instructed to color while thinking about the emotions they were feeling and what their mind was focused on.  Education: LRT educated patients on healthy coping skills.  Education Outcome: Acknowledges education/In group clarification offered   Clinical Observations/Feedback: Patient arrived to group at approximately 9:55 am. LRT explained activity. Patient colored coloring sheet. Patient left group at approximately 10:05 am with Dr. Ardyth HarpsHernandez. Patient returned to group at approximately 10:09 am. Patient contributed to group discussion by stating what emotions he felt while he was coloring and what his mind was focused on.  Jacquelynn CreeGreene,Zac Torti M, LRT/CTRS 06/03/2016 10:28 AM

## 2016-06-03 NOTE — Progress Notes (Addendum)
  [facility] :  Will you be returning to the same living situation after discharge:  Yes,  group home At discharge, do you have transportation home?: Yes,  group home Do you have the ability to pay for your medications: Yes.  Release of information consent forms completed and in the chart;  Patient's signature needed at discharge.  Patient to Follow up at: Follow-up Information    Follow up with [facility]. Go on 06/08/2016.   Why:  [facility] for your hospital follow-up. We ask that you arrive at 7am for prompt services. If you have any questions or concerns you may contact [facility] or [name] at [phone]   Contact information:    [address], [place], [place] [address] Hours: 9AM-5PM Phone: [phone]      Next level of care provider has access to [facility] Link:no  Safety Planning and Suicide Prevention discussed: Yes,  reviewed with pt and legal guardian  Have you used any form of tobacco in the last 30 days? (Cigarettes, Smokeless Tobacco, Cigars, and/or Pipes): No  Has patient been referred to the Quitline?: Patient refused referral  Patient has been referred for addiction treatment: N/A  Lynden OxfordKadijah R Amerie Beaumont, LCSW-A 06/03/2016, 9:27 AM

## 2016-07-08 ENCOUNTER — Other Ambulatory Visit: Payer: Self-pay | Admitting: Internal Medicine

## 2016-07-08 DIAGNOSIS — R1084 Generalized abdominal pain: Secondary | ICD-10-CM

## 2016-07-11 ENCOUNTER — Emergency Department
Admission: EM | Admit: 2016-07-11 | Discharge: 2016-08-08 | Disposition: A | Discharge status: Home / Self Care | Payer: Medicaid Other | Attending: Emergency Medicine | Admitting: Emergency Medicine

## 2016-07-11 ENCOUNTER — Encounter: Payer: Self-pay | Admitting: Emergency Medicine

## 2016-07-11 DIAGNOSIS — S61519A Laceration without foreign body of unspecified wrist, initial encounter: Secondary | ICD-10-CM

## 2016-07-11 DIAGNOSIS — F3162 Bipolar disorder, current episode mixed, moderate: Secondary | ICD-10-CM | POA: Diagnosis not present

## 2016-07-11 DIAGNOSIS — F603 Borderline personality disorder: Secondary | ICD-10-CM

## 2016-07-11 DIAGNOSIS — Z79899 Other long term (current) drug therapy: Secondary | ICD-10-CM | POA: Insufficient documentation

## 2016-07-11 DIAGNOSIS — F3160 Bipolar disorder, current episode mixed, unspecified: Secondary | ICD-10-CM

## 2016-07-11 DIAGNOSIS — J45909 Unspecified asthma, uncomplicated: Secondary | ICD-10-CM | POA: Insufficient documentation

## 2016-07-11 DIAGNOSIS — F3163 Bipolar disorder, current episode mixed, severe, without psychotic features: Secondary | ICD-10-CM | POA: Diagnosis not present

## 2016-07-11 DIAGNOSIS — S61511D Laceration without foreign body of right wrist, subsequent encounter: Secondary | ICD-10-CM | POA: Diagnosis not present

## 2016-07-11 DIAGNOSIS — F17210 Nicotine dependence, cigarettes, uncomplicated: Secondary | ICD-10-CM | POA: Insufficient documentation

## 2016-07-11 DIAGNOSIS — F319 Bipolar disorder, unspecified: Secondary | ICD-10-CM | POA: Diagnosis present

## 2016-07-11 DIAGNOSIS — R45851 Suicidal ideations: Secondary | ICD-10-CM | POA: Diagnosis present

## 2016-07-11 HISTORY — DX: Intentional self-harm by other specified means, initial encounter: X83.8XXA

## 2016-07-11 LAB — COMPREHENSIVE METABOLIC PANEL
ALBUMIN: 4.8 g/dL (ref 3.5–5.0)
ALK PHOS: 50 U/L (ref 38–126)
ALT: 44 U/L (ref 17–63)
AST: 33 U/L (ref 15–41)
Anion gap: 7 (ref 5–15)
BUN: 14 mg/dL (ref 6–20)
CALCIUM: 10 mg/dL (ref 8.9–10.3)
CHLORIDE: 104 mmol/L (ref 101–111)
CO2: 27 mmol/L (ref 22–32)
CREATININE: 0.81 mg/dL (ref 0.61–1.24)
GFR calc Af Amer: >60 mL/min (ref >60)
GFR calc non Af Amer: >60 mL/min (ref >60)
GLUCOSE: 125 mg/dL — AB (ref 65–99)
Potassium: 3.6 mmol/L (ref 3.5–5.1)
SODIUM: 138 mmol/L (ref 135–145)
Total Bilirubin: 0.4 mg/dL (ref 0.3–1.2)
Total Protein: 7.6 g/dL (ref 6.5–8.1)

## 2016-07-11 LAB — URINE DRUG SCREEN, QUALITATIVE (ARMC ONLY)
AMPHETAMINES, UR SCREEN: NOT DETECTED
Barbiturates, Ur Screen: NOT DETECTED
Benzodiazepine, Ur Scrn: NOT DETECTED
COCAINE METABOLITE, UR ~~LOC~~: NOT DETECTED
Cannabinoid 50 Ng, Ur ~~LOC~~: NOT DETECTED
MDMA (ECSTASY) UR SCREEN: NOT DETECTED
METHADONE SCREEN, URINE: NOT DETECTED
OPIATE, UR SCREEN: NOT DETECTED
PHENCYCLIDINE (PCP) UR S: NOT DETECTED
Tricyclic, Ur Screen: NOT DETECTED

## 2016-07-11 LAB — CBC
HEMATOCRIT: 43.4 % (ref 40.0–52.0)
HEMOGLOBIN: 14.9 g/dL (ref 13.0–18.0)
MCH: 31 pg (ref 26.0–34.0)
MCHC: 34.4 g/dL (ref 32.0–36.0)
MCV: 90.1 fL (ref 80.0–100.0)
Platelets: 203 10*3/uL (ref 150–440)
RBC: 4.82 MIL/uL (ref 4.40–5.90)
RDW: 13.2 % (ref 11.5–14.5)
WBC: 8 10*3/uL (ref 3.8–10.6)

## 2016-07-11 LAB — ACETAMINOPHEN LEVEL: Acetaminophen (Tylenol), Serum: <10 ug/mL — ABNORMAL LOW (ref 10–30)

## 2016-07-11 LAB — ETHANOL: Alcohol, Ethyl (B): <5 mg/dL (ref <5)

## 2016-07-11 LAB — SALICYLATE LEVEL

## 2016-07-11 MED ORDER — TETANUS-DIPHTH-ACELL PERTUSSIS 5-2.5-18.5 LF-MCG/0.5 IM SUSP
0.5000 mL | Freq: Once | INTRAMUSCULAR | Status: AC
  Administered 2016-07-11: 0.5 mL via INTRAMUSCULAR
  Filled 2016-07-11: qty 0.5

## 2016-07-11 NOTE — ED Triage Notes (Signed)
Pt with thoughts of suicidal thoughts and attempt today with glass cutting his left arm and almost jumped off a balcony. Pt states him and his mother had a disagreement.  Left arm wrapped in guaze. Cut marks noted to right side face.

## 2016-07-11 NOTE — ED Provider Notes (Addendum)
[facility] Emergency Department Provider Note  ____________________________________________   I have reviewed the triage vital signs and the nursing notes.   HISTORY  Chief Complaint Suicidal (attempted with cutting)    HPI Jerald KiefBrandon Boardley is a 20 y.o. male with a history he states of bipolar disorder states he was cutting himself because he wanted to kill himself. Denies taking an overdose. Apparently has done this before. None of the cuts are serious or deep. He apparently threatened to cut his own jugular but did not. Tetanus he believes is more than 5 years ago. Apparently, he was on a balcony and threatened to cut his throat at the police came. He states this happened because "I got in a fight with my mom".     Past Medical History:  Diagnosis Date  . Asthma   . Bipolar 1 disorder (HCC)   . Suicide [facility](HCC)     Patient Active Problem List   Diagnosis Date Noted  . GERD (gastroesophageal reflux disease) 06/02/2016  . Tobacco use disorder 06/02/2016  . Asthma 06/02/2016  . Suicidal ideation 06/01/2016  . Personality disorder 06/01/2016  . Bipolar disorder (HCC) 06/01/2016    History reviewed. No pertinent surgical history.  Prior to Admission medications   Medication Sig Start Date End Date Taking? Authorizing Provider  albuterol (PROVENTIL HFA;VENTOLIN HFA) 108 (90 Base) MCG/ACT inhaler Inhale 2 puffs into the lungs every 6 (six) hours as needed for wheezing or shortness of breath.    Historical Provider, MD  clonazePAM (KLONOPIN) 0.5 MG tablet Take 1 tablet (0.5 mg total) by mouth 3 (three) times daily. 06/02/16   Jimmy FootmanAndrea Hernandez-Gonzalez, MD  divalproex (DEPAKOTE ER) 500 MG 24 hr tablet Take 500 mg by mouth 2 (two) times daily.     Historical Provider, MD  escitalopram (LEXAPRO) 20 MG tablet Take 1 tablet (20 mg total) by mouth daily. 06/03/16   Jimmy FootmanAndrea Hernandez-Gonzalez, MD  ibuprofen (ADVIL,MOTRIN) 800 MG tablet Take 1 tablet (800 mg total)  by mouth every 8 (eight) hours as needed. 05/30/16   Charmayne Sheerharles M Beers, PA-C  lactobacillus acidophilus (BACID) TABS tablet Take 1 tablet by mouth daily.    Historical Provider, MD  Melatonin 3 MG TABS Take 3 mg by mouth at bedtime.    Historical Provider, MD  OLANZapine (ZYPREXA) 20 MG tablet Take 20 mg by mouth at bedtime.    Historical Provider, MD  OLANZapine (ZYPREXA) 5 MG tablet Take 5 mg by mouth every morning.    Historical Provider, MD  omeprazole (PRILOSEC) 40 MG capsule Take 40 mg by mouth every evening.    Historical Provider, MD    Allergies Review of patient's allergies indicates no known allergies.  Family History  Problem Relation Age of Onset  . Adopted: Yes    Social History Social History  Substance Use Topics  . Smoking status: Current Every Day Smoker    Packs/day: 2.00    Types: Cigarettes    Last attempt to quit: 08/18/2013  . Smokeless tobacco: Current User  . Alcohol use Yes     Comment: 1/5th of brandy per month. drank 8oz of beer today.    Review of Systems Constitutional: No fever/chills Eyes: No visual changes. ENT: No sore throat. No stiff neck no neck pain Cardiovascular: Denies chest pain. Respiratory: Denies shortness of breath. Gastrointestinal:   no vomiting.  No diarrhea.  No constipation. Genitourinary: Negative for dysuria. Musculoskeletal: Negative lower extremity swelling Skin: See history of present illness. Neurological: Negative for severe  headaches, focal weakness or numbness. 10-point ROS otherwise negative.  ____________________________________________   PHYSICAL EXAM:  VITAL SIGNS: ED Triage Vitals [07/11/16 1843]  Enc Vitals Group     BP 138/82     Pulse Rate 84     Resp 20     Temp 99.5 F (37.5 C)     Temp Source Oral     SpO2 98 %     Weight 213 lb (96.6 kg)     Height 5\' 10"  (1.778 m)     Head Circumference      Peak Flow      Pain Score      Pain Loc      Pain Edu?      Excl. in GC?     Constitutional:  Alert and oriented. Well appearing and in no acute distress. Eyes: Conjunctivae are normal. PERRL. EOMI. Head: Atraumatic. Nose: No congestion/rhinnorhea. Mouth/Throat: Mucous membranes are moist.  Oropharynx non-erythematous. Neck: No stridor.   Nontender with no meningismus Cardiovascular: Normal rate, regular rhythm. Grossly normal heart sounds.  Good peripheral circulation. Respiratory: Normal respiratory effort.  No retractions. Lungs CTAB. Abdominal: Soft and nontender. No distention. No guarding no rebound Back:  There is no focal tenderness or step off.  there is no midline tenderness there are no lesions noted. there is no CVA tenderness Musculoskeletal: No lower extremity tenderness, no upper extremity tenderness. No joint effusions, no DVT signs strong distal pulses no edema Neurologic:  Normal speech and language. No gross focal neurologic deficits are appreciated.  Skin:  Numerous very superficial scratches noted to the right face, right neck, and left arm. None of them require sutures. Psychiatric: Mood and affect are normal. Speech and behavior are normal.  ____________________________________________   LABS (all labs ordered are listed, but only abnormal results are displayed)  Labs Reviewed  CBC  COMPREHENSIVE METABOLIC PANEL  ETHANOL  SALICYLATE LEVEL  ACETAMINOPHEN LEVEL  URINE DRUG SCREEN, QUALITATIVE ([facility] ONLY)   ____________________________________________  EKG  I personally interpreted any EKGs ordered by me or triage  ____________________________________________  RADIOLOGY  I reviewed any imaging ordered by me or triage that were performed during my shift and, if possible, patient and/or family made aware of any abnormal findings. ____________________________________________   PROCEDURES  Procedure(s) performed: None  Procedures  Critical Care performed: None  ____________________________________________   INITIAL IMPRESSION / ASSESSMENT  AND PLAN / ED COURSE  Pertinent labs & imaging results that were available during my care of the patient were reviewed by me and considered in my medical decision making (see chart for details).  Patient here for cutting and threatened suicide. He is under involuntary commitment. None of the scratches require sutures or antibiotics. We will, however, give him a tetanus shot. He states that he did not take an overdose but we will check basic blood work anyway.  Clinical Course   _______  ----------------------------------------- 12:25 AM on 07/12/2016 -----------------------------------------  Signed out to dr. Zenda AlpersWebster at the end of my shift. _____________________________________   FINAL CLINICAL IMPRESSION(S) / ED DIAGNOSES  Final diagnoses:  None      This chart was dictated using voice recognition software.  Despite best efforts to proofread,  errors can occur which can change meaning.      Jeanmarie PlantJames A Kate Sweetman, MD 07/11/16 1936    Jeanmarie PlantJames A Etheridge Geil, MD 07/11/16 [id number]    Jeanmarie PlantJames A Iyana Topor, MD 07/12/16 Moses Manners0025

## 2016-07-11 NOTE — ED Notes (Signed)
Cleaned and dressed pt L arm with gauze and bandage. Small amount of blood coming from cuts on pt arm.

## 2016-07-11 NOTE — ED Notes (Signed)
Pt had a verbal altercation with the Pt on room 22. Charge nurse notified.

## 2016-07-11 NOTE — ED Notes (Signed)
Pt given sandwich tray and sprite.  

## 2016-07-12 ENCOUNTER — Ambulatory Visit: Admission: RE | Admit: 2016-07-12 | Payer: Medicaid Other | Source: Ambulatory Visit | Admitting: Internal Medicine

## 2016-07-12 DIAGNOSIS — F603 Borderline personality disorder: Secondary | ICD-10-CM

## 2016-07-12 DIAGNOSIS — F3162 Bipolar disorder, current episode mixed, moderate: Secondary | ICD-10-CM

## 2016-07-12 DIAGNOSIS — S61511D Laceration without foreign body of right wrist, subsequent encounter: Secondary | ICD-10-CM

## 2016-07-12 DIAGNOSIS — S61519A Laceration without foreign body of unspecified wrist, initial encounter: Secondary | ICD-10-CM

## 2016-07-12 MED ORDER — CLONAZEPAM 0.5 MG PO TABS: 0.5000 mg | ORAL_TABLET | Freq: Three times a day (TID) | ORAL | 0 refills | Status: DC

## 2016-07-12 NOTE — ED Notes (Signed)

## 2016-07-12 NOTE — ED Notes (Signed)
Patient currently in room meeting with [facility] representative. No signs of distress noted at this time. Maintained on 15 minute checks and observation by security camera for safety.

## 2016-07-12 NOTE — ED Notes (Signed)
Patient resting quietly in room. No noted distress or abnormal behaviors noted. Will continue 15 minute checks and observation by security camera for safety. 

## 2016-07-12 NOTE — ED Notes (Signed)
Nurse re-wrapped patient arm, patient has several lacerations to his left arm extending from his wrist to above his elbow. Clean dry dressing applied. Patient told to monitor for pain and drainage.

## 2016-07-12 NOTE — BH Assessment (Signed)
Assessment Note  Kyle Wang is an 20 y.o. male. Pt presents to the ED due to having suicidal thoughts and cuts to his arm. Pt reports 6-7 previous suicide attempts in the past. He reports currently living in a group home which he states he enjoys. He states he attends a Day Program 5 days a week. Pt is currently being follow-up for medication management treatment with Dr. Bard [facility]Moffit with [facility]. Pt reports having symptoms of depression ("not wanting to be on this earth"), hopelessness ("everything I do is wrong")- he reports feeling this way "most days". Pt denied having any concerns about his sleep and/or appetite. Pt denied any use of alcohol/drugs.  Pt denied homicidal thoughts/hallucinations/delusions.  Towards the end of TTS assessment, pt states "I think it would be beneficial for me to go inpatient".  Diagnosis:  Bipolar 1 Disorder  Past Medical History:  Past Medical History:  Diagnosis Date  . Asthma   . Bipolar 1 disorder (HCC)   . Suicide [facility](HCC)     History reviewed. No pertinent surgical history.  Family History:  Family History  Problem Relation Age of Onset  . Adopted: Yes    Social History:  reports that he has been smoking Cigarettes.  He has been smoking about 2.00 packs per day. He uses smokeless tobacco. He reports that he drinks alcohol. He reports that he does not use drugs.  Additional Social History:  Alcohol / Drug Use Pain Medications: None Reported Prescriptions: Depakote, Zyprexa, Klonopin Over the Counter: None Reported History of alcohol / drug use?: No history of alcohol / drug abuse Longest period of sobriety (when/how long): N/A  CIWA: CIWA-Ar BP: 138/82 Pulse Rate: 84 COWS:    Allergies: No Known Allergies  Home Medications:  (Not in a hospital admission)  OB/GYN Status:  No LMP for male patient.  General Assessment Data [facility] Assessment: In system Is this a Tele or Face-to-Face Assessment?:  Face-to-Face Is this an Initial Assessment or a Re-assessment for this encounter?: Initial Assessment Marital status: Single Maiden name: N/A Is patient pregnant?: No Pregnancy Status: No Living Arrangements: Group Home Can pt return to current living arrangement?: Yes Admission Status: Voluntary Is patient capable of signing voluntary admission?: Yes Referral Source: Self/Family/Friend Insurance type: [employer]  Medical Screening Exam [facility] Walk-in ONLY) Medical Exam completed: Yes  Crisis Care Plan Living Arrangements: Group Home Legal Guardian:  Otho Bellows([place]) Name of Psychiatrist: Dr. Bard HerbertMoffit - [facility] Name of Therapist: None  Education Status Is patient currently in school?: No Current Grade: N/A Highest grade of school patient has completed: GED Name of school: [employer] Contact person: N/A  Risk to self with the past 6 months Suicidal Ideation: No-Not Currently/Within Last 6 Months Has patient been a risk to self within the past 6 months prior to admission? : Yes Suicidal Intent: No-Not Currently/Within Last 6 Months Has patient had any suicidal intent within the past 6 months prior to admission? : Yes Is patient at risk for suicide?: Yes Suicidal Plan?: No Has patient had any suicidal plan within the past 6 months prior to admission? : Yes Access to Means: No What has been your use of drugs/alcohol within the last 12 months?: None reported Previous Attempts/Gestures: Yes How many times?: 7 Other Self Harm Risks: cuts Triggers for Past Attempts: Unknown Intentional Self Injurious Behavior: Cutting Comment - Self Injurious Behavior: Cutting Family Suicide History: Unknown Recent stressful life event(s): Other (Comment) (Depression) Persecutory voices/beliefs?: No Depression: Yes Depression Symptoms:  Feeling worthless/self pity Substance abuse history and/or treatment for substance abuse?: Yes Suicide prevention information given to non-admitted patients:  Yes  Risk to Others within the past 6 months Homicidal Ideation: No Does patient have any lifetime risk of violence toward others beyond the six months prior to admission? : No Thoughts of Harm to Others: No Current Homicidal Intent: No Current Homicidal Plan: No Access to Homicidal Means: No Identified Victim: None reported History of harm to others?: No Assessment of Violence: None Noted Violent Behavior Description: N/A Does patient have access to weapons?: No Criminal Charges Pending?: No Does patient have a court date: No Is patient on probation?: No  Psychosis Hallucinations: None noted Delusions: None noted  Mental Status Report Appearance/Hygiene: In scrubs, In hospital gown Eye Contact: Good Motor Activity: Unremarkable, Freedom of movement Speech: Logical/coherent Level of Consciousness: Alert Mood: Pleasant Affect: Appropriate to circumstance Anxiety Level: None Thought Processes: Coherent, Relevant Judgement: Unimpaired Orientation: Person, Place, Time, Situation, Appropriate for developmental age Obsessive Compulsive Thoughts/Behaviors: None  Cognitive Functioning Concentration: Normal Memory: Recent Intact, Remote Intact IQ: Average Insight: Good Impulse Control: Fair Appetite: Good Weight Loss: 0 Weight Gain: 0 Sleep: No Change Total Hours of Sleep: 7 Vegetative Symptoms: None  ADLScreening [facility] Assessment Services) Patient's cognitive ability adequate to safely complete daily activities?: Yes Patient able to express need for assistance with ADLs?: Yes Independently performs ADLs?: Yes (appropriate for developmental age)  Prior Inpatient Therapy Prior Inpatient Therapy: Yes Prior Therapy Dates: 7/11/217 Prior Therapy Facilty/Provider(s): [facility], [facility], [facility] [place] and others Reason for Treatment: Bipolar Disorder  Prior Outpatient Therapy Prior Outpatient Therapy: Yes Prior Therapy Dates:  Current Prior Therapy Facilty/Provider(s): Dr. Bard HerbertMoffit - [facility] Reason for Treatment: Bipolar Disorder Does patient have an ACCT team?: No Does patient have Intensive In-House Services?  : No Does patient have [name] services? : No Does patient have P4CC services?: No  ADL Screening (condition at time of admission) Patient's cognitive ability adequate to safely complete daily activities?: Yes Patient able to express need for assistance with ADLs?: Yes Independently performs ADLs?: Yes (appropriate for developmental age)       Abuse/Neglect Assessment (Assessment to be complete while patient is alone) Physical Abuse: Denies Verbal Abuse: Denies Sexual Abuse: Denies Exploitation of patient/patient's resources: Denies Self-Neglect: Denies Values / Beliefs Cultural Requests During Hospitalization: None Spiritual Requests During Hospitalization: None Consults Spiritual Care Consult Needed: No Social Work Consult Needed: No Merchant navy officerAdvance Directives (For Healthcare) Does patient have an advance directive?: No Would patient like information on creating an advanced directive?: No - patient declined information    Additional Information 1:1 In Past 12 Months?: Yes CIRT Risk: No Elopement Risk: No Does patient have medical clearance?: Yes  Child/Adolescent Assessment Running Away Risk: Denies Bed-Wetting: Denies Destruction of Property: Denies Cruelty to Animals: Denies Stealing: Denies Rebellious/Defies Authority: Denies Satanic Involvement: Denies Archivistire Setting: Denies Problems at [employer]: Denies Gang Involvement: Denies  Disposition:  Disposition Initial Assessment Completed for this Encounter: Yes Disposition of Patient: Referred to (Psych MD to see) Patient referred to: Other (Comment) (Psych MD to see)  On Site Evaluation by:   Reviewed with Physician:    Wilmon ArmsSTEVENSON, SHALETA 07/12/2016 1:43 AM

## 2016-07-12 NOTE — ED Notes (Addendum)
Patient resting quietly in room. Patient received dinner tray. No noted distress or abnormal behaviors noted. Will continue 15 minute checks and observation by security camera for safety.

## 2016-07-12 NOTE — ED Notes (Signed)
Nurse re-wrapped patient's left arm wounds. Lacerations remain clean dry and intact. No signs of distress noted at this time. Maintained on 15 minute checks and observation by security camera for safety.

## 2016-07-12 NOTE — Progress Notes (Signed)
CSW contacted [facility] [phone] and [phone] to let them know that pt is ready for d/c and someone will need to pick pt up as he was not given a proper 30 day d/c. No answer, left a message. Will try again later.  Jonathon JordanLynn B Jalila Goodnough, MSW, Theresia MajorsLCSWA [phone]

## 2016-07-12 NOTE — ED Notes (Signed)
Ultrasound called stating that the patient had an appointment for a complete abdominal ultrasound today. Nurse notified ultrasound team that patient had eaten and that there was no record of this outpatient ultrasound in the chart. Ultrasound tech stated that if the patient remained in the hospital until 08/23 patient should be NPO after midnight on 08/22.

## 2016-07-12 NOTE — ED Notes (Signed)
Patient currently in room resting at this time. Patient shows no signs of distress. Maintained on 15 minute checks and observation by security camera for safety.

## 2016-07-12 NOTE — ED Provider Notes (Signed)
Patient has been cleared by psychiatry for discharge.   Kyle FilbertJonathan E Takyia Sindt, MD 07/12/16 [id number]

## 2016-07-12 NOTE — ED Notes (Signed)
Patient in room watching television. No signs of distress noted. Maintained on 15 minute checks and observation by security camera for safety.  

## 2016-07-12 NOTE — ED Notes (Signed)
Baird Lyonsasey from DSS called to say that they were reviewing the patient's case and that Larita FifeLynn in Social Work should call them at [phone] or [phone] after 8 am for an update.

## 2016-07-12 NOTE — ED Notes (Signed)
Patient caregiver Wess BottsBrenda Tarain [phone]) called stating that the patient cannot return to his current group home at [facility]. Patient Mother Laney PastorRose Gomes number [phone]) provided by caregiver. Staff advised to follow up with mother concerning placement options.

## 2016-07-12 NOTE — ED Notes (Signed)
Patient currently denies SI/HI/AVH and pain. Patient voices that the reason for his sucidie attempt was that he is frustrated with his mother being his guardian even though he is 20 years old. Patient states that at this time he would prefer having a state guardian to look after his care. He even voiced that at some point he would like to manipulate the system and be sent to [facility] by acting bizarrely so that he could possibly get another guardian. Nurse advised patient that this was a manipulative strategy and that perhaps social work could speak with him about the process of getting another guardian. Nurse continued to provide reassurance. Patient is calm and cooperative at this time. Maintained on 15 minute checks and observation by security camera for safety.

## 2016-07-12 NOTE — Discharge Instructions (Addendum)
You have been seen in the Emergency Department (ED)  today for a psychiatric complaint.  You have been evaluated by psychiatry and we believe you are safe to be discharged from the hospital.   ° °Please return to the Emergency Department (ED)  immediately if you have ANY thoughts of hurting yourself or anyone else, so that we may help you. ° °Please avoid alcohol and drug use. ° °Follow up with your doctor and/or therapist as soon as possible regarding today's ED  visit.  ° °You may call crisis hotline for  County at [phone]. ° °

## 2016-07-12 NOTE — ED Notes (Signed)
Patient has a lacerations to his left arm that are covered by several layers of gauze. Dressing clean dry and intact at this time. Patient denies pain in the area and no signs of infection noted.

## 2016-07-12 NOTE — ED Notes (Signed)
ENVIRONMENTAL ASSESSMENT Potentially harmful objects out of patient reach: Yes Personal belongings secured: Yes Patient dressed in hospital provided attire only: Yes Plastic bags out of patient reach: Yes Patient care equipment (cords, cables, call bells, lines, and drains) shortened, removed, or accounted for: Yes Equipment and supplies removed from bottom of stretcher: Yes Potentially toxic materials out of patient reach: Yes Sharps container removed or out of patient reach: Yes  Patient is currently in room sleeping. No signs of distress noted. Maintained on 15 minute checks and observation by security camera for safety.  

## 2016-07-12 NOTE — Progress Notes (Signed)
CSW contacted [facility] [employer] [phone] and reported that pt has been dropped off by group home at the ED and is now ready for d/c but group home will not answer the phone. Worker states that a sheriff will go to the property and request for pt to be picked up. CSW awaiting call back.  Jonathon JordanLynn B Alec Jaros, MSW, Theresia MajorsLCSWA [phone]

## 2016-07-12 NOTE — ED Notes (Signed)
Pt. Alert and oriented, warm and dry, in no distress. Pt. Denies SI, HI, and AVH. Pt. Encouraged to let nursing staff know of any concerns or needs. 

## 2016-07-12 NOTE — Consult Note (Signed)
[facility] Consult   Reason for Consult:  Consult for this 20 year old man with a history of chronic mood instability came in after cutting himself on the arm Referring Physician:  Jimmye Norman Patient Identification: Kyle Wang MRN:  [id number] Principal Diagnosis: Self-inflicted laceration of wrist Diagnosis:   Patient Active Problem List   Diagnosis Date Noted  . Self-inflicted laceration of wrist [S61.519A] 07/12/2016  . Borderline personality disorder [F60.3] 07/12/2016  . GERD (gastroesophageal reflux disease) [K21.9] 06/02/2016  . Tobacco use disorder [F17.200] 06/02/2016  . Asthma [J45.909] 06/02/2016  . Suicidal ideation [R45.851] 06/01/2016  . Personality disorder [F60.9] 06/01/2016  . Bipolar disorder ([place]) [F31.9] 06/01/2016    Total Time spent with patient: 1 hour  Subjective:   Kyle Wang is a 20 y.o. male patient admitted with "I felt suicidal".  HPI:  Patient interviewed. Chart reviewed. Labs and vitals reviewed. This 20 year old man was brought from his group home after he cut himself extensively on the left forearm and threatened to jump off a flight of stairs. He says that he did this because he had a conversation with his mother on the telephone which was frustrating to him. He was so frustrated he broke a Geologist, engineering and then took a shard of glass and cut himself extensively on the left forearm. Doesn't sound like he did himself any serious or life-threatening damage with it. He was threatening to jump off a flight of stairs but did not do it. Patient states that prior to this incident his mood had been about the same as usual. Not necessarily more depressed. Also since coming into the hospital he is back to feeling normal. Doesn't have any current suicidal thoughts. Not feeling depressed. His sleep habits of been stable. He has been compliant with his medication. He denies any abuse of alcohol or drugs. He has been going to [place] and goes to  together house for therapy every day.  Social history: Mother is his guardian but both parents are living and together. Patient lives in a group home.  Medical history: History of multiple self mutilations but no other ongoing active medical problems besides the psychiatric.  Substance abuse history: Denies that he's ever had any alcohol or drug problems.  Past Psychiatric History: Long history of impulsivity self-mutilation and self injury. Multiple hospitalizations. Including long state hospital stays. Tends to get frustrated easily and then to act out. Currently being treated with Depakote and Zyprexa. Followed up at [facility].  Risk to Self: Suicidal Ideation: No-Not Currently/Within Last 6 Months Suicidal Intent: No-Not Currently/Within Last 6 Months Is patient at risk for suicide?: Yes Suicidal Plan?: No Access to Means: No What has been your use of drugs/alcohol within the last 12 months?: None reported How many times?: 7 Other Self Harm Risks: cuts Triggers for Past Attempts: Unknown Intentional Self Injurious Behavior: Cutting Comment - Self Injurious Behavior: Cutting Risk to Others: Homicidal Ideation: No Thoughts of Harm to Others: No Current Homicidal Intent: No Current Homicidal Plan: No Access to Homicidal Means: No Identified Victim: None reported History of harm to others?: No Assessment of Violence: None Noted Violent Behavior Description: N/A Does patient have access to weapons?: No Criminal Charges Pending?: No Does patient have a court date: No Prior Inpatient Therapy: Prior Inpatient Therapy: Yes Prior Therapy Dates: 7/11/217 Prior Therapy Facilty/Provider(s): [facility], [place], [place], [place], [place], [place], and others Reason for Treatment: Bipolar Disorder Prior Outpatient Therapy: Prior Outpatient Therapy: Yes Prior Therapy Dates: Current Prior Therapy Facilty/Provider(s):  Dr. Bard Herbert - [facility] Reason for Treatment: Bipolar Disorder Does  patient have an ACCT team?: No Does patient have Intensive In-House Services?  : No Does patient have [name] services? : No Does patient have P4CC services?: No  Past Medical History:  Past Medical History:  Diagnosis Date  . Asthma   . Bipolar 1 disorder (HCC)   . Suicide [facility])    History reviewed. No pertinent surgical history. Family History:  Family History  Problem Relation Age of Onset  . Adopted: Yes   Family Psychiatric  History: Biological mother and father both reportedly had bipolar disorder. Patient is adopted by his current parents Social History:  History  Alcohol Use  . Yes    Comment: 1/5th of brandy per month. drank 8oz of beer today.     History  Drug Use No    Social History   Social History  . Marital status: Single    Spouse name: N/A  . Number of children: N/A  . Years of education: N/A   Social History Main Topics  . Smoking status: Current Every Day Smoker    Packs/day: 2.00    Types: Cigarettes    Last attempt to quit: 08/18/2013  . Smokeless tobacco: Current User  . Alcohol use Yes     Comment: 1/5th of brandy per month. drank 8oz of beer today.  . Drug use: No  . Sexual activity: Not Asked   Other Topics Concern  . None   Social History Narrative  . None   Additional Social History:    Allergies:  No Known Allergies  Labs:  Results for orders placed or performed during the hospital encounter of 07/11/16 (from the past 48 hour(s))  Comprehensive metabolic panel     Status: Abnormal   Collection Time: 07/11/16  6:45 PM  Result Value Ref Range   Sodium 138 135 - 145 mmol/L   Potassium 3.6 3.5 - 5.1 mmol/L   Chloride 104 101 - 111 mmol/L   CO2 27 22 - 32 mmol/L   Glucose, Bld 125 (H) 65 - 99 mg/dL   BUN 14 6 - 20 mg/dL   Creatinine, Ser 7.80 0.61 - 1.24 mg/dL   Calcium 22.1 8.9 - 94.8 mg/dL   Total Protein 7.6 6.5 - 8.1 g/dL   Albumin 4.8 3.5 - 5.0 g/dL   AST 33 15 - 41 U/L   ALT 44 17 - 63 U/L   Alkaline Phosphatase 50  38 - 126 U/L   Total Bilirubin 0.4 0.3 - 1.2 mg/dL   GFR calc non Af Amer >60 >60 mL/min   GFR calc Af Amer >60 >60 mL/min    Comment: (NOTE) The eGFR has been calculated using the CKD EPI equation. This calculation has not been validated in all clinical situations. eGFR's persistently <60 mL/min signify possible Chronic Kidney Disease.    Anion gap 7 5 - 15  Ethanol     Status: None   Collection Time: 07/11/16  6:45 PM  Result Value Ref Range   Alcohol, Ethyl (B) <5 <5 mg/dL    Comment:        LOWEST DETECTABLE LIMIT FOR SERUM ALCOHOL IS 5 mg/dL FOR MEDICAL PURPOSES ONLY   Salicylate level     Status: None   Collection Time: 07/11/16  6:45 PM  Result Value Ref Range   Salicylate Lvl <4.0 2.8 - 30.0 mg/dL  Acetaminophen level     Status: Abnormal   Collection Time: 07/11/16  6:45 PM  Result Value Ref Range   Acetaminophen (Tylenol), Serum <10 (L) 10 - 30 ug/mL    Comment:        THERAPEUTIC CONCENTRATIONS VARY SIGNIFICANTLY. A RANGE OF 10-30 ug/mL MAY BE AN EFFECTIVE CONCENTRATION FOR MANY PATIENTS. HOWEVER, SOME ARE BEST TREATED AT CONCENTRATIONS OUTSIDE THIS RANGE. ACETAMINOPHEN CONCENTRATIONS >150 ug/mL AT 4 HOURS AFTER INGESTION AND >50 ug/mL AT 12 HOURS AFTER INGESTION ARE OFTEN ASSOCIATED WITH TOXIC REACTIONS.   cbc     Status: None   Collection Time: 07/11/16  6:45 PM  Result Value Ref Range   WBC 8.0 3.8 - 10.6 K/uL   RBC 4.82 4.40 - 5.90 MIL/uL   Hemoglobin 14.9 13.0 - 18.0 g/dL   HCT 43.4 40.0 - 52.0 %   MCV 90.1 80.0 - 100.0 fL   MCH 31.0 26.0 - 34.0 pg   MCHC 34.4 32.0 - 36.0 g/dL   RDW 13.2 11.5 - 14.5 %   Platelets 203 150 - 440 K/uL  Urine Drug Screen, Qualitative     Status: None   Collection Time: 07/11/16  6:50 PM  Result Value Ref Range   Tricyclic, Ur Screen NONE DETECTED NONE DETECTED   Amphetamines, Ur Screen NONE DETECTED NONE DETECTED   MDMA (Ecstasy)Ur Screen NONE DETECTED NONE DETECTED   Cocaine Metabolite,Ur [place] NONE DETECTED NONE  DETECTED   Opiate, Ur Screen NONE DETECTED NONE DETECTED   Phencyclidine (PCP) Ur S NONE DETECTED NONE DETECTED   Cannabinoid 50 Ng, Ur [place] NONE DETECTED NONE DETECTED   Barbiturates, Ur Screen NONE DETECTED NONE DETECTED   Benzodiazepine, Ur Scrn NONE DETECTED NONE DETECTED   Methadone Scn, Ur NONE DETECTED NONE DETECTED    Comment: (NOTE) 101  Tricyclics, urine               Cutoff 1000 ng/mL 200  Amphetamines, urine             Cutoff 1000 ng/mL 300  MDMA (Ecstasy), urine           Cutoff 500 ng/mL 400  Cocaine Metabolite, urine       Cutoff 300 ng/mL 500  Opiate, urine                   Cutoff 300 ng/mL 600  Phencyclidine (PCP), urine      Cutoff 25 ng/mL 700  Cannabinoid, urine              Cutoff 50 ng/mL 800  Barbiturates, urine             Cutoff 200 ng/mL 900  Benzodiazepine, urine           Cutoff 200 ng/mL 1000 Methadone, urine                Cutoff 300 ng/mL 1100 1200 The urine drug screen provides only a preliminary, unconfirmed 1300 analytical test result and should not be used for non-medical 1400 purposes. Clinical consideration and professional judgment should 1500 be applied to any positive drug screen result due to possible 1600 interfering substances. A more specific alternate chemical method 1700 must be used in order to obtain a confirmed analytical result.  1800 Gas chromato graphy / mass spectrometry (GC/MS) is the preferred 1900 confirmatory method.     No current facility-administered medications for this encounter.    Current Outpatient Prescriptions  Medication Sig Dispense Refill  . albuterol (PROVENTIL HFA;VENTOLIN HFA) 108 (90 Base) MCG/ACT inhaler Inhale 2 puffs into the lungs every 6 (six)  hours as needed for wheezing or shortness of breath.    . clonazePAM (KLONOPIN) 0.5 MG tablet Take 1 tablet (0.5 mg total) by mouth 3 (three) times daily. 90 tablet 0  . divalproex (DEPAKOTE ER) 500 MG 24 hr tablet Take 500 mg by mouth 2 (two) times daily.     Marland Kitchen  escitalopram (LEXAPRO) 20 MG tablet Take 1 tablet (20 mg total) by mouth daily. 30 tablet 0  . ibuprofen (ADVIL,MOTRIN) 800 MG tablet Take 1 tablet (800 mg total) by mouth every 8 (eight) hours as needed. 30 tablet 0  . lactobacillus acidophilus (BACID) TABS tablet Take 1 tablet by mouth daily.    . Melatonin 3 MG TABS Take 3 mg by mouth at bedtime.    Marland Kitchen OLANZapine (ZYPREXA) 20 MG tablet Take 20 mg by mouth at bedtime.    Marland Kitchen OLANZapine (ZYPREXA) 5 MG tablet Take 5 mg by mouth every morning.    Marland Kitchen omeprazole (PRILOSEC) 40 MG capsule Take 40 mg by mouth every evening.      Musculoskeletal: Strength & Muscle Tone: within normal limits Gait & Station: normal Patient leans: N/A  Psychiatric Specialty Exam: Physical Exam  Constitutional: He appears well-developed and well-nourished.  HENT:  Head: Normocephalic and atraumatic.  Eyes: Conjunctivae are normal. Pupils are equal, round, and reactive to light.  Neck: Normal range of motion.  Cardiovascular: Normal heart sounds.   Respiratory: Effort normal.  GI: Soft.  Musculoskeletal: Normal range of motion.  Neurological: He is alert.  Skin: Skin is warm and dry.     Psychiatric: His speech is normal and behavior is normal. Thought content normal. His affect is blunt. Cognition and memory are normal. He expresses impulsivity.    Review of Systems  Constitutional: Negative.   HENT: Negative.   Eyes: Negative.   Respiratory: Negative.   Cardiovascular: Negative.   Gastrointestinal: Negative.   Musculoskeletal: Negative.   Skin: Negative.   Neurological: Negative.   Psychiatric/Behavioral: Negative for depression, hallucinations, memory loss, substance abuse and suicidal ideas. The patient is not nervous/anxious and does not have insomnia.     Blood pressure 100/74, pulse 74, temperature 98.2 F (36.8 C), temperature source Oral, resp. rate 18, height '5\' 10"'$  (1.778 m), weight 96.6 kg (213 lb), SpO2 98 %.Body mass index is 30.56 kg/m.    General Appearance: Casual  Eye Contact:  Good  Speech:  Clear and Coherent  Volume:  Decreased  Mood:  Euthymic  Affect:  Constricted  Thought Process:  Goal Directed  Orientation:  Full (Time, Place, and Person)  Thought Content:  Logical  Suicidal Thoughts:  No  Homicidal Thoughts:  No  Memory:  Immediate;   Good Recent;   Fair Remote;   Fair  Judgement:  Fair  Insight:  Present  Psychomotor Activity:  Decreased  Concentration:  Concentration: Fair  Recall:  [employer] of Knowledge:  Fair  Language:  Fair  Akathisia:  No  Handed:  Right  AIMS (if indicated):     Assets:  Communication Skills Desire for Improvement Financial Resources/Insurance Housing Resilience Social Support  ADL's:  Intact  Cognition:  WNL  Sleep:        Treatment Plan Summary: Plan 20 year old man with a history of bipolar disorder and borderline personality disorder. Acted out impulsively in a very borderline manner after a frustrating conversation. Now back to baseline mental state. No active intent or thought currently about harming himself. Not psychotic. Patient does not require and in fact would  be unlikely to benefit from inpatient hospital treatment. Patient agrees with this. No change to medicine. Discontinue IVC. Supportive counseling done. He can be discharged back to his group home. The representative out reach person from [facility] came and met with him today as well. I have written another prescription for his Klonopin as I confirm from the database that he did get that recently and has run out of it.  Disposition: Patient does not meet criteria for psychiatric inpatient admission. Supportive therapy provided about ongoing stressors.  Alethia Berthold, MD 07/12/2016 4:22 PM

## 2016-07-12 NOTE — ED Notes (Signed)
Patient asleep in room. No noted distress or abnormal behavior. Will continue 15 minute checks and observation by security cameras for safety. 

## 2016-07-12 NOTE — ED Notes (Signed)
Patient in room speaking with psychiatrist at this time. No signs of acute distress noted. Maintained on 15 minute checks and observation by security camera for safety.

## 2016-07-12 NOTE — Progress Notes (Signed)
CSW received call from SantiagoBrenda from [facility] [phone]. Steward DroneBrenda stated that the police came to her residence requesting her to accept pt back. Steward DroneBrenda states that she is unwilling to accept pt back and will not have him back under any circumstances.   CSW spoke with pt's legal guardian (mother) [phone]. Pt's mom states she agrees that pt should not return to the group home, however, is also unwilling to accept pt into her home.   CSW will consult with her supervisor again and develop a new d/c plan.  Jonathon JordanLynn B Cyriah Childrey, MSW, Theresia MajorsLCSWA [phone]

## 2016-07-12 NOTE — ED Notes (Signed)
Patient states that he does not wish to go back to the old group home. He says that he feels like he would improve somewhere else. Nurse stated that the social worker was beginning to work on further placement for the patient. Maintained on 15 minute checks and observation by security camera for safety.

## 2016-07-13 MED ORDER — OLANZAPINE 5 MG PO TABS
5.0000 mg | ORAL_TABLET | Freq: Every day | ORAL | Status: DC
  Administered 2016-07-13 – 2016-08-08 (×27): 5 mg via ORAL
  Filled 2016-07-13 (×26): qty 1

## 2016-07-13 MED ORDER — OLANZAPINE 5 MG PO TABS
ORAL_TABLET | ORAL | Status: AC
  Administered 2016-07-13: 5 mg via ORAL
  Filled 2016-07-13: qty 1

## 2016-07-13 MED ORDER — ESCITALOPRAM OXALATE 10 MG PO TABS
20.0000 mg | ORAL_TABLET | Freq: Every day | ORAL | Status: DC
  Administered 2016-07-13 – 2016-08-08 (×27): 20 mg via ORAL
  Filled 2016-07-13 (×27): qty 2

## 2016-07-13 MED ORDER — OLANZAPINE 10 MG PO TABS
20.0000 mg | ORAL_TABLET | Freq: Every day | ORAL | Status: DC
  Administered 2016-07-13 – 2016-08-07 (×26): 20 mg via ORAL
  Filled 2016-07-13 (×27): qty 2

## 2016-07-13 MED ORDER — DIVALPROEX SODIUM 500 MG PO DR TAB
500.0000 mg | DELAYED_RELEASE_TABLET | Freq: Two times a day (BID) | ORAL | Status: DC
  Administered 2016-07-13 – 2016-08-08 (×52): 500 mg via ORAL
  Filled 2016-07-13 (×51): qty 1

## 2016-07-13 MED ORDER — ESCITALOPRAM OXALATE 10 MG PO TABS
ORAL_TABLET | ORAL | Status: AC
  Administered 2016-07-13: 20 mg via ORAL
  Filled 2016-07-13: qty 2

## 2016-07-13 NOTE — ED Notes (Signed)
Patient asleep in room. No noted distress or abnormal behavior. Will continue 15 minute checks and observation by security cameras for safety. 

## 2016-07-13 NOTE — ED Notes (Signed)
Patient spoke with mother on the phone. He remained in behavioral control. Cooperative with nursing interventions. Maintained on 15 minute checks and observation by security camera for safety.

## 2016-07-13 NOTE — ED Notes (Signed)
Patient in dayroom, social with peers. In no apparent distress.Maintained on 15 minute checks and observation by security camera for safety.

## 2016-07-13 NOTE — ED Notes (Signed)
Patient given meal tray and beverage. 

## 2016-07-13 NOTE — ED Notes (Signed)
Patient received meal tray and beverage. 

## 2016-07-13 NOTE — ED Notes (Signed)
Patient asleep in room. No noted distress or abnormal behavior. Will continue 15 minute checks and observation by security cameras for safety. Breakfast tray left at bedside. 

## 2016-07-13 NOTE — ED Notes (Signed)
Patient took a shower. He denies depressed mood or any urges to cut himself. Maintained on 15 minute checks and observation by security camera for safety.

## 2016-07-13 NOTE — ED Notes (Signed)
Patient resting quietly in room. No noted distress or abnormal behaviors noted. Will continue 15 minute checks and observation by security camera for safety. 

## 2016-07-13 NOTE — ED Notes (Signed)
ENVIRONMENTAL ASSESSMENT  Potentially harmful objects out of patient reach: Yes.  Personal belongings secured: Yes.  Patient dressed in hospital provided attire only: Yes.  Plastic bags out of patient reach: Yes.  Patient care equipment (cords, cables, call bells, lines, and drains) shortened, removed, or accounted for: Yes.  Equipment and supplies removed from bottom of stretcher: Yes.  Potentially toxic materials out of patient reach: Yes.  Sharps container removed or out of patient reach: Yes.   BEHAVIORAL HEALTH ROUNDING  Patient sleeping: No.  Patient alert and oriented: yes  Behavior appropriate: Yes. ; If no, describe:  Nutrition and fluids offered: Yes  Toileting and hygiene offered: Yes  Sitter present: not applicable, Q 15 min safety rounds and observation via security camera. Law enforcement present: Yes ODS  ED BHU PLACEMENT JUSTIFICATION  Is the patient under IVC or is there intent for IVC: No. Is the patient medically cleared: Yes.  Is there vacancy in the ED BHU: Yes.  Is the population mix appropriate for patient: Yes.  Is the patient awaiting placement in inpatient or outpatient setting: Yes. Pt awaiting group home placement. Social work continues to follow.  Has the patient had a psychiatric consult: Yes.  Survey of unit performed for contraband, proper placement and condition of furniture, tampering with fixtures in bathroom, shower, and each patient room: Yes. ; Findings: All clear  APPEARANCE/BEHAVIOR  calm, cooperative and adequate rapport can be established  NEURO ASSESSMENT  Orientation: time, place and person  Hallucinations: No.None noted (Hallucinations)  Speech: Normal  Gait: normal  RESPIRATORY ASSESSMENT  WNL  CARDIOVASCULAR ASSESSMENT  WNL  GASTROINTESTINAL ASSESSMENT  WNL  EXTREMITIES  WNL  PLAN OF CARE  Provide calm/safe environment. Vital signs assessed twice daily. ED BHU Assessment once each 12-hour shift. Collaborate with TTS and social  worker daily or as condition indicates. Assure the ED provider has rounded once each shift. Provide and encourage hygiene. Provide redirection as needed. Assess for escalating behavior; address immediately and inform ED provider.  Assess family dynamic and appropriateness for visitation as needed: Yes. ; If necessary, describe findings:  Educate the patient/family about BHU procedures/visitation: Yes. ; If necessary, describe findings: Pt is calm and cooperative at this time. Pt understanding and accepting of unit procedures/rules. Will continue to monitor with Q 15 min safety rounds and observation via security camera.

## 2016-07-13 NOTE — ED Notes (Signed)
BEHAVIORAL HEALTH ROUNDING  Patient sleeping: No.  Patient alert and oriented: yes  Behavior appropriate: Yes. ; If no, describe:  Nutrition and fluids offered: Yes  Toileting and hygiene offered: Yes  Sitter present: not applicable, Q 15 min safety rounds and observation via security camera. Law enforcement present: Yes ODS  

## 2016-07-13 NOTE — ED Notes (Signed)

## 2016-07-13 NOTE — ED Notes (Signed)
VOL/Pending Group Home Placement 

## 2016-07-13 NOTE — ED Notes (Addendum)
BEHAVIORAL HEALTH ROUNDING  Patient sleeping: No.  Patient alert and oriented: yes  Behavior appropriate: Yes. ; If no, describe:  Nutrition and fluids offered: Yes  Toileting and hygiene offered: Yes  Sitter present: not applicable, Q 15 min safety rounds and observation via security camera. Law enforcement present: Yes ODS  Sandwich tray and Sprite given to pt. Pt calm and cooperative at this time. No co's voiced no needs voiced. Encouraged to let staff know of any needs or concerns.

## 2016-07-13 NOTE — ED Notes (Signed)
Patient awake, alert, and oriented. Cooperative with nursing interventions.  Patient denies SI or any urges to cut himself. He is aware that he cannot return to group home due to his recent behaviors.  Multiple lacerations L arm - no s/s infection. Patient encourage to keep area clean. Maintained on 15 minute checks and observation by security camera for safety.

## 2016-07-13 NOTE — ED Provider Notes (Signed)
-----------------------------------------   7:19 AM on 07/13/2016 -----------------------------------------   Blood pressure 123/63, pulse 62, temperature 97.9 F (36.6 C), temperature source Oral, resp. rate 16, height 5\' 10"  (1.778 m), weight 213 lb (96.6 kg), SpO2 98 %.  Evidently patient was cleared by psychiatry for discharge yesterday but group home refused to accept patient back. Disposition per clinical social work.   Irean HongJade J Sung, MD 07/13/16 [phone]

## 2016-07-14 NOTE — ED Notes (Signed)
BEHAVIORAL HEALTH ROUNDING Patient sleeping: Yes.   Patient alert and oriented: not applicable SLEEPING Behavior appropriate: Yes.  ; If no, describe: SLEEPING Nutrition and fluids offered: No SLEEPING Toileting and hygiene offered: NoSLEEPING Sitter present: not applicable, Q 15 min safety rounds and observation via security camera. Law enforcement present: Yes ODS 

## 2016-07-14 NOTE — ED Notes (Signed)
Pt remains in day room playing cards and socializing.

## 2016-07-14 NOTE — ED Notes (Signed)
Breakfast tray given. °

## 2016-07-14 NOTE — ED Notes (Signed)
Pt in shower at this time

## 2016-07-14 NOTE — Progress Notes (Signed)
CSW called [facility] about pt [phone]. Worker will call back to ask for more information if she believes that pt could be a good fit.   CSW called [facility] [phone]. No answer, left a message.  CSW called [facility] [phone]. No beds available.  [facility] [phone]. Facility states pt is too acute for their level of care.  CSW called [facility] [phone]. No beds available at this time.  CSW called [facility] [phone]. No beds available at this time.  [facility] [phone]. No beds available at this time. Worker also stated that [facility] is also full at this time.  CSW will continue to look for placement.   Kyle Wang, Kyle Wang, Kyle Wang [phone]

## 2016-07-14 NOTE — ED Notes (Signed)
Pt lying in bed watching TV at this time

## 2016-07-14 NOTE — ED Notes (Signed)
Lunch tray given to pt.

## 2016-07-14 NOTE — ED Provider Notes (Signed)
-----------------------------------------   7:28 AM on 07/14/2016 -----------------------------------------  No acute events overnight. Patient was cleared by psychiatry for discharge to his group home over the group home has refused the patient. Patient's mother has been notified, she agrees that the patient should not return to the group home but also refuses to take the patient home. Social workers currently working on this case.   Minna AntisKevin Tedra Coppernoll, MD 07/14/16 [phone]

## 2016-07-14 NOTE — ED Notes (Signed)
Pt in day room at this time. Socializing with another pt

## 2016-07-14 NOTE — ED Notes (Signed)
Patient noted in day room. No complaints, stable, in no acute distress. Q15 minute rounds and monitoring via Security Cameras to continue. 

## 2016-07-14 NOTE — ED Notes (Signed)
Patient noted in room. No complaints, stable, in no acute distress. Q15 minute rounds and monitoring via Security Cameras to continue.  

## 2016-07-14 NOTE — ED Notes (Signed)
Pt in dayroom playing cards with another pt.

## 2016-07-14 NOTE — Consult Note (Signed)
[facility] Face-to-Face Psychiatry Consult   Reason for Consult:  Consult for this 20 year old man with a history of chronic mood instability came in after cutting himself on the arm Referring Physician:  Mayford Knife Patient Identification: Kyle Wang MRN:  [id number] Principal Diagnosis: Self-inflicted laceration of wrist Diagnosis:   Patient Active Problem List   Diagnosis Date Noted  . Self-inflicted laceration of wrist [S61.519A] 07/12/2016  . Borderline personality disorder [F60.3] 07/12/2016  . GERD (gastroesophageal reflux disease) [K21.9] 06/02/2016  . Tobacco use disorder [F17.200] 06/02/2016  . Asthma [J45.909] 06/02/2016  . Suicidal ideation [R45.851] 06/01/2016  . Personality disorder [F60.9] 06/01/2016  . Bipolar disorder (HCC) [F31.9] 06/01/2016    Total Time spent with patient: 15 minutes  Subjective:   Kyle Wang is a 20 y.o. male patient admitted with "I felt suicidal".  Follow-up for this 20 year old with borderline personality disorder. He continues to be in our emergency room because his previous group home refuses to take him back and his mother refuses to take him. Despite his being psychiatrically stable it is not appropriate to simply discharge him to the shelter given that he has a guardian. Social work remains in some kind of negotiations about trying to place him but he does not require psychiatric hospitalization.  HPI:  Patient interviewed. Chart reviewed. Labs and vitals reviewed. This 20 year old man was brought from his group home after he cut himself extensively on the left forearm and threatened to jump off a flight of stairs. He says that he did this because he had a conversation with his mother on the telephone which was frustrating to him. He was so frustrated he broke a [employer] broker and then took a shard of glass and cut himself extensively on the left forearm. Doesn't sound like he did himself any serious or life-threatening damage with it. He was  threatening to jump off a flight of stairs but did not do it. Patient states that prior to this incident his mood had been about the same as usual. Not necessarily more depressed. Also since coming into the hospital he is back to feeling normal. Doesn't have any current suicidal thoughts. Not feeling depressed. His sleep habits of been stable. He has been compliant with his medication. He denies any abuse of alcohol or drugs. He has been going to [facility] and goes to together house for therapy every day.  Social history: Mother is his guardian but both parents are living and together. Patient lives in a group home.  Medical history: History of multiple self mutilations but no other ongoing active medical problems besides the psychiatric.  Substance abuse history: Denies that he's ever had any alcohol or drug problems.  Past Psychiatric History: Long history of impulsivity self-mutilation and self injury. Multiple hospitalizations. Including long state hospital stays. Tends to get frustrated easily and then to act out. Currently being treated with Depakote and Zyprexa. Followed up at [facility].  Risk to Self: Suicidal Ideation: No-Not Currently/Within Last 6 Months Suicidal Intent: No-Not Currently/Within Last 6 Months Is patient at risk for suicide?: Yes Suicidal Plan?: No Access to Means: No What has been your use of drugs/alcohol within the last 12 months?: None reported How many times?: 7 Other Self Harm Risks: cuts Triggers for Past Attempts: Unknown Intentional Self Injurious Behavior: Cutting Comment - Self Injurious Behavior: Cutting Risk to Others: Homicidal Ideation: No Thoughts of Harm to Others: No Current Homicidal Intent: No Current Homicidal Plan: No Access to Homicidal Means: No Identified Victim: None reported History of harm  to others?: No Assessment of Violence: None Noted Violent Behavior Description: N/A Does patient have access to weapons?: No Criminal Charges Pending?:  No Does patient have a court date: No Prior Inpatient Therapy: Prior Inpatient Therapy: Yes Prior Therapy Dates: 7/11/217 Prior Therapy Facilty/Provider(s): [facility] [place] [place] others Reason for Treatment: Bipolar Disorder Prior Outpatient Therapy: Prior Outpatient Therapy: Yes Prior Therapy Dates: Current Prior Therapy Facilty/Provider(s): Dr. Bard [facility]Moffit - [facility] Reason for Treatment: Bipolar Disorder Does patient have an ACCT team?: No Does patient have Intensive In-House Services?  : No Does patient have [name] services? : No Does patient have P4CC services?: No  Past Medical History:  Past Medical History:  Diagnosis Date  . Asthma   . Bipolar 1 disorder (HCC)   . Suicide [facility](HCC)    History reviewed. No pertinent surgical history. Family History:  Family History  Problem Relation Age of Onset  . Adopted: Yes   Family Psychiatric  History: Biological mother and father both reportedly had bipolar disorder. Patient is adopted by his current parents Social History:  History  Alcohol Use  . Yes    Comment: 1/5th of brandy per month. drank 8oz of beer today.     History  Drug Use No    Social History   Social History  . Marital status: Single    Spouse name: N/A  . Number of children: N/A  . Years of education: N/A   Social History Main Topics  . Smoking status: Current Every Day Smoker    Packs/day: 2.00    Types: Cigarettes    Last attempt to quit: 08/18/2013  . Smokeless tobacco: Current User  . Alcohol use Yes     Comment: 1/5th of brandy per month. drank 8oz of beer today.  . Drug use: No  . Sexual activity: Not Asked   Other Topics Concern  . None   Social History Narrative  . None   Additional Social History:    Allergies:  No Known Allergies  Labs:  No results found for this or any previous visit (from the past 48 hour(s)).  Current Facility-Administered Medications  Medication Dose Route  Frequency Provider Last Rate Last Dose  . divalproex (DEPAKOTE) DR tablet 500 mg  500 mg Oral Q12H Audery AmelJohn T Elliot Simoneaux, MD   500 mg at 07/14/16 1005  . escitalopram (LEXAPRO) tablet 20 mg  20 mg Oral Daily Audery AmelJohn T Javad Salva, MD   20 mg at 07/14/16 1005  . OLANZapine (ZYPREXA) tablet 20 mg  20 mg Oral QHS Audery AmelJohn T Ismar Yabut, MD   20 mg at 07/13/16 2109  . OLANZapine (ZYPREXA) tablet 5 mg  5 mg Oral Daily Audery AmelJohn T Lashanti Chambless, MD   5 mg at 07/14/16 1005   Current Outpatient Prescriptions  Medication Sig Dispense Refill  . albuterol (PROVENTIL HFA;VENTOLIN HFA) 108 (90 Base) MCG/ACT inhaler Inhale 2 puffs into the lungs every 6 (six) hours as needed for wheezing or shortness of breath.    . clonazePAM (KLONOPIN) 0.5 MG tablet Take 1 tablet (0.5 mg total) by mouth 3 (three) times daily. 90 tablet 0  . divalproex (DEPAKOTE ER) 500 MG 24 hr tablet Take 500 mg by mouth 2 (two) times daily.     Marland Kitchen. escitalopram (LEXAPRO) 20 MG tablet Take 1 tablet (20 mg total) by mouth daily. 30 tablet 0  . ibuprofen (ADVIL,MOTRIN) 800 MG tablet Take 1 tablet (800 mg total) by mouth every 8 (eight) hours as needed.  30 tablet 0  . lactobacillus acidophilus (BACID) TABS tablet Take 1 tablet by mouth daily.    . Melatonin 3 MG TABS Take 3 mg by mouth at bedtime.    Marland Kitchen OLANZapine (ZYPREXA) 20 MG tablet Take 20 mg by mouth at bedtime.    Marland Kitchen OLANZapine (ZYPREXA) 5 MG tablet Take 5 mg by mouth every morning.    Marland Kitchen omeprazole (PRILOSEC) 40 MG capsule Take 40 mg by mouth every evening.      Musculoskeletal: Strength & Muscle Tone: within normal limits Gait & Station: normal Patient leans: N/A  Psychiatric Specialty Exam: Physical Exam  Nursing note and vitals reviewed. Constitutional: He appears well-developed and well-nourished.  HENT:  Head: Normocephalic and atraumatic.  Eyes: Conjunctivae are normal. Pupils are equal, round, and reactive to light.  Neck: Normal range of motion.  Cardiovascular: Regular rhythm and normal heart sounds.    Respiratory: Effort normal. No respiratory distress.  GI: Soft.  Musculoskeletal: Normal range of motion.  Neurological: He is alert.  Skin: Skin is warm and dry.     Psychiatric: He has a normal mood and affect. His speech is normal and behavior is normal. Judgment and thought content normal. His affect is not blunt. Cognition and memory are normal. He does not express impulsivity.    Review of Systems  Constitutional: Negative.   HENT: Negative.   Eyes: Negative.   Respiratory: Negative.   Cardiovascular: Negative.   Gastrointestinal: Negative.   Musculoskeletal: Negative.   Skin: Negative.   Neurological: Negative.   Psychiatric/Behavioral: Negative for depression, hallucinations, memory loss, substance abuse and suicidal ideas. The patient is not nervous/anxious and does not have insomnia.     Blood pressure 128/68, pulse 83, temperature 98 F (36.7 C), temperature source Oral, resp. rate 16, height 5\' 10"  (1.778 m), weight 96.6 kg (213 lb), SpO2 98 %.Body mass index is 30.56 kg/m.  General Appearance: Casual  Eye Contact:  Good  Speech:  Clear and Coherent  Volume:  Decreased  Mood:  Euthymic  Affect:  Constricted  Thought Process:  Goal Directed  Orientation:  Full (Time, Place, and Person)  Thought Content:  Logical  Suicidal Thoughts:  No  Homicidal Thoughts:  No  Memory:  Immediate;   Good Recent;   Fair Remote;   Fair  Judgement:  Fair  Insight:  Present  Psychomotor Activity:  Decreased  Concentration:  Concentration: Fair  Recall:  [employer] of Knowledge:  Fair  Language:  Fair  Akathisia:  No  Handed:  Right  AIMS (if indicated):     Assets:  Communication Skills Desire for Improvement Financial Resources/Insurance Housing Resilience Social Support  ADL's:  Intact  Cognition:  WNL  Sleep:        Treatment Plan Summary: Plan Patient continues to be in the emergency room essentially as a compassionate measure as his actual guardians are  refusing to take care of him. No need for any change to psychiatric treatment. I'm trying to keep updated with social work daily but it's not clear that there is any real progress.  Disposition: Patient does not meet criteria for psychiatric inpatient admission. Supportive therapy provided about ongoing stressors.  Mordecai Rasmussen, MD 07/14/2016 4:24 PM

## 2016-07-14 NOTE — ED Notes (Signed)
Pt eating dinner tray in dayroom.   

## 2016-07-14 NOTE — ED Notes (Signed)
Report given to Gary, RN 

## 2016-07-14 NOTE — ED Notes (Signed)
Report to include Situation, Background, Assessment, and Recommendations received from [facility]. Patient alert and oriented, warm and dry, in no acute distress. Patient denies SI, HI, AVH and pain. Patient made aware of Q15 minute rounds and security cameras for their safety. Patient instructed to come to me with needs or concerns.

## 2016-07-14 NOTE — ED Notes (Signed)
Patient resting quietly in room. No noted distress or abnormal behaviors noted. Will continue 15 minute checks and observation by security camera for safety. 

## 2016-07-15 NOTE — ED Notes (Signed)

## 2016-07-15 NOTE — ED Notes (Signed)
Patient noted sleeping in room. No complaints, stable, in no acute distress. Q15 minute rounds and monitoring via Security Cameras to continue.  

## 2016-07-15 NOTE — ED Notes (Signed)
Pt watching tv has been no behavior problem , in no distress sw still seeking placement

## 2016-07-15 NOTE — Progress Notes (Signed)
LCSW called [facility] and was [facility] [phone] Voice mail box full.  Called Patient Guardian  ( [phone]) [phone]  Arrie SenateClaudine Nayara Taplin LCSW [phone]

## 2016-07-15 NOTE — ED Notes (Signed)
Patient noted in room. No complaints, stable, in no acute distress. Q15 minute rounds and monitoring via Security Cameras to continue.  

## 2016-07-15 NOTE — ED Notes (Signed)
Report was received from Verdis Fredericksonuth B., RN; Pt. Verbalizes no complaints or distress; verbalizes having S.I.; with noted self inflicted lacerations(superficial) on arms; denies having Hi. Continue to monitor with 15 min. Monitoring.

## 2016-07-15 NOTE — ED Provider Notes (Signed)
-----------------------------------------   7:39 AM on 07/15/2016 -----------------------------------------   Blood pressure 118/69, pulse 63, temperature 98.6 F (37 C), temperature source Oral, resp. rate 16, height 5\' 10"  (1.778 m), weight 213 lb (96.6 kg), SpO2 98 %.  The patient had no acute events since last update.  Calm and cooperative at this time.  Disposition is pending per CSW team recommendations.     Irean HongJade J Farrah Skoda, MD 07/15/16 [phone]

## 2016-07-16 NOTE — ED Notes (Signed)
Patient asleep in room. No noted distress or abnormal behavior. Will continue 15 minute checks and observation by security cameras for safety. 

## 2016-07-16 NOTE — ED Notes (Signed)

## 2016-07-16 NOTE — ED Notes (Signed)
Patient awake briefly to take morning medications. He denies SI or thoughts to cut himself.  Maintained on 15 minute checks and observation by security camera for safety.

## 2016-07-16 NOTE — ED Notes (Signed)
Patient resting quietly in room. No noted distress or abnormal behaviors noted. Will continue 15 minute checks and observation by security camera for safety. 

## 2016-07-16 NOTE — Clinical Social Work Note (Signed)
Clinical Social Work Assessment  Patient Details  Name: Kyle Wang MRN: [id number] Date of Birth: 04/09/1996  Date of referral:  07/16/16               Reason for consult:  Facility Placement                Permission sought to share information with:  [place] granted to share information::  Yes, Verbal Permission Granted  Name::     Kyle Wang [phone] Guardian  Agency::  yes  Relationship::  yes  Contact Information:  [facility]  Housing/Transportation Living arrangements for the past 2 months:  Group Home Source of Information:  Patient, Guardian Patient Interpreter Needed:  None Criminal Activity/Legal Involvement Pertinent to Current Situation/Hospitalization:  No - Comment as needed Significant Relationships:  Parents Lives with:  Facility Resident Do you feel safe going back to the place where you live?    Need for family participation in patient care:  Yes (Comment)  Care giving concerns:  Patient is unable to return to group home due to incident with staff   Social Worker assessment / plan: LCSW met and introduced self to patient. Obtained verbal consent to speak to his mother and care providers. Patient is oriented x4. Patient  Has good insight to his anger and recent out burst. He has remained calm, polite, medication compliant and cooperative with nursing and SW staff. Patient understands that he needs to obtain a therapist from [place] in addition to his out patient psychiatric support with Dr Ernie Hew. Patient is agreeable to learn better coping skills,stress reduction and anger management. Patient is insured by Medicaid [place]. Plan for LCSW is to find new group home placement/complete and send out information via the hub.  Employment status:  Disabled (Comment on whether or not currently receiving Disability) Insurance information:  Medicaid [place] PT Recommendations:  Not assessed at this time Information / Referral  to community resources:  Outpatient Psychiatric Care (Comment Required) ([facility]- Dr Ernie Hew)  Patient/Family's Response to care:  Unable to reach patients guardian left message on Friday Aug 25th and Sat Aug 26th awaiting call back, home number unable to leave a message  Patient/Family's Understanding of and Emotional Response to Diagnosis, Current Treatment, and Prognosis:  TBD  Emotional Assessment Appearance:  Appears stated age Attitude/Demeanor/Rapport:   (calm,polite stable) Affect (typically observed):  Accepting, Anxious, Calm Orientation:  Oriented to Self, Oriented to Place, Oriented to  Time, Oriented to Situation Alcohol / Substance use:  Alcohol Use Psych involvement (Current and /or in the community):  Outpatient Provider  Discharge Needs  Concerns to be addressed:  Homelessness, Discharge Planning Concerns, Mental Health Concerns Readmission within the last 30 days:  No Current discharge risk:  Homeless Barriers to Discharge:  Other (Requires [facility]/Group home)   Joana Reamer, [place] 07/16/2016, 1:58 PM

## 2016-07-16 NOTE — ED Notes (Signed)
Patient took a shower. Maintained on 15 minute checks and observation by security camera for safety. 

## 2016-07-16 NOTE — ED Notes (Signed)
Patient in dayroom, sitting quietly.Maintained on 15 minute checks and observation by security camera for safety.

## 2016-07-16 NOTE — ED Notes (Signed)
Patient given meal tray and beverage. 

## 2016-07-16 NOTE — ED Notes (Signed)
Patient watching television in his room. Offers no complaints. Maintained on 15 minute checks and observation by security camera for safety.

## 2016-07-16 NOTE — ED Notes (Signed)
Patient received meal tray and beverage. Maintained on 15 minute checks and observation by security camera for safety. 

## 2016-07-16 NOTE — ED Provider Notes (Signed)
-----------------------------------------   6:59 AM on 07/16/2016 -----------------------------------------   Blood pressure 128/76, pulse 62, temperature 98.1 F (36.7 C), temperature source Oral, resp. rate 20, height 5\' 10"  (1.778 m), weight 96.6 kg, SpO2 100 %.  The patient had no acute events since last update.  Calm and cooperative at this time.  Disposition is pending CSW recommendations.      Loleta Roseory Oather Muilenburg, MD 07/16/16 [phone]

## 2016-07-17 NOTE — ED Notes (Signed)
Patient awake. Out in dayroom, socializing with another patient. Offers no complaints.

## 2016-07-17 NOTE — ED Notes (Signed)
Patient asleep in room. No noted distress or abnormal behavior. Will continue 15 minute checks and observation by security cameras for safety. 

## 2016-07-17 NOTE — ED Notes (Signed)
Maintained on 15 minute checks and observation by security camera for safety. 

## 2016-07-17 NOTE — ED Notes (Signed)
Patient awake, alert, and oriented. Mood is euthymic with congruent affect. Patient denies any urges to hurt himself. Appetite good. Cooperative with all nursing interventions. Maintained on 15 minute checks and observation by security camera for safety.

## 2016-07-17 NOTE — Progress Notes (Signed)
Called [facility] to make a referral for this patient to obtain a care coordinator. [facility]( [facility] Thorn) and she was to make a transfer and request a new care coordinator. THIS did not Happen. LCSW made the referral with intake worker Renay and a Care Coordinator will follow up and review this patients case and call LCSW back  LCSW called Guardian and let her  Know a new referral for a [facility] Care Coordinator was done and a CCC will connect with LCSW on Monday to meet with patient and hopefully able to assist with Level 3/4 care for patient and they will contact the guardian as well.  No bed offers made at this time  [employer] Air LinesClaudine Octave [place] LCSW [phone]

## 2016-07-17 NOTE — Progress Notes (Signed)
LCSW met with patient and he is agreeable to call different family care homes to see if they have any beds. LCSW reviewed the list with patient and he will start calling himself on Monday.  [employer] LCSW [phone]

## 2016-07-17 NOTE — ED Provider Notes (Signed)
-----------------------------------------   8:14 AM on 07/17/2016 -----------------------------------------   Blood pressure 116/63, pulse (!) 56, temperature 97.8 F (36.6 C), temperature source Oral, resp. rate 18, height 5\' 10"  (1.778 m), weight 213 lb (96.6 kg), SpO2 98 %.  The patient had no acute events since last update.  Calm and cooperative at this time.  Disposition is pending Psychiatry/Behavioral Medicine team recommendations.     Arnaldo NatalPaul F Maaz Spiering, MD 07/17/16 [phone]

## 2016-07-17 NOTE — ED Notes (Signed)
Patient given meal tray and beverage. 

## 2016-07-17 NOTE — Progress Notes (Signed)
Late Note: LCSW received call from pts mother and guardian on cell phone. This worker was informed by guardian that patient is not able to return to Group Home nor to her house. LCSW was informed patient has a care coordinator and has not lived at home since he was [age]. LCSW completed assessment and Fl2 and sent out information via HUB and is awaiting bed offers for family care homes/ALF.  She is convinced that her son requires Level 3/4 home.LCSW agreed to call [facility] and will have Larita FifeLynn other LCSW to follow up on Monday as LME are only able to place-refer patients to this care facilities.  [employer] Air LinesClaudine Sylver [place] LCSW [phone]

## 2016-07-17 NOTE — ED Notes (Signed)
Patient socializing, affect bright, mood is cheerful. No thoughts of self harm. Maintained on 15 minute checks and observation by security camera for safety.

## 2016-07-17 NOTE — ED Notes (Signed)

## 2016-07-17 NOTE — ED Notes (Signed)
Patient in dayroom watching television. Maintained on 15 minute checks and observation by security camera for safety. 

## 2016-07-17 NOTE — ED Notes (Signed)

## 2016-07-17 NOTE — ED Notes (Signed)
ED BHU PLACEMENT JUSTIFICATION Is the patient under IVC or is there intent for IVC: No. Is the patient medically cleared: Yes.   Is there vacancy in the ED BHU: Yes.   Is the population mix appropriate for patient: Yes.   Is the patient awaiting placement in inpatient or outpatient setting: Yes.   Has the patient had a psychiatric consult: Yes.   Survey of unit performed for contraband, proper placement and condition of furniture, tampering with fixtures in bathroom, shower, and each patient room: Yes.  ; Findings: NA APPEARANCE/BEHAVIOR calm, cooperative and adequate rapport can be established NEURO ASSESSMENT Orientation: time, place and person Hallucinations: No.None noted (Hallucinations) Speech: Normal Gait: normal RESPIRATORY ASSESSMENT Normal expansion.  Clear to auscultation.  No rales, rhonchi, or wheezing. CARDIOVASCULAR ASSESSMENT regular rate and rhythm, S1, S2 normal, no murmur, click, rub or gallop GASTROINTESTINAL ASSESSMENT soft, nontender, BS WNL, no r/g EXTREMITIES normal strength, tone, and muscle mass PLAN OF CARE Provide calm/safe environment. Vital signs assessed twice daily. ED BHU Assessment once each 12-hour shift. Collaborate with intake RN daily or as condition indicates. Assure the ED provider has rounded once each shift. Provide and encourage hygiene. Provide redirection as needed. Assess for escalating behavior; address immediately and inform ED provider.  Assess family dynamic and appropriateness for visitation as needed: Yes.  ; If necessary, describe findings: NA Educate the patient/family about BHU procedures/visitation: Yes.  ; If necessary, describe findings: NA  

## 2016-07-17 NOTE — ED Notes (Signed)
Pt  Vol  Pending  placement 

## 2016-07-17 NOTE — ED Notes (Signed)
Pt is alert and oriented this evening. Pt mood is appropriate and his affect is bright. Pt is pleasant and cooperative with staff and [facility]. Pt denies SI/HI and AVH a this time and is taking medications as prescribed. 15 minute checks are ongoing for safety.

## 2016-07-17 NOTE — ED Notes (Signed)
Pt. Alert and oriented, warm and dry, in no distress. Pt. Denies SI, HI, and AVH. Pt. Encouraged to let nursing staff know of any concerns or needs. 

## 2016-07-17 NOTE — ED Notes (Signed)
Patient resting quietly in room. No noted distress or abnormal behaviors noted. Will continue 15 minute checks and observation by security camera for safety. 

## 2016-07-18 NOTE — ED Notes (Signed)
Patient is currently in the shower. Patient took all scheduled medications. No signs of distress noted. Maintained on 15 minute checks and observation by security camera for safety.

## 2016-07-18 NOTE — ED Notes (Signed)
Patient resting quietly in room. No noted distress or abnormal behaviors noted. Will continue 15 minute checks and observation by security camera for safety. 

## 2016-07-18 NOTE — ED Provider Notes (Signed)
-----------------------------------------   7:09 AM on 07/18/2016 -----------------------------------------   Blood pressure (!) 94/52, pulse 60, temperature 97.7 F (36.5 C), temperature source Oral, resp. rate 16, height 5\' 10"  (1.778 m), weight 213 lb (96.6 kg), SpO2 98 %.  The patient had no acute events since last update.  Calm and cooperative at this time.  Disposition is pending Psychiatry/Behavioral Medicine team recommendations.  ----------------------------------------- 8:27 AM on 07/18/2016 -----------------------------------------  Blood pressure improved to 122/59 at this time without any intervention.     Gayla DossEryka A Annalisia Ingber, MD 07/18/16 [phone]

## 2016-07-18 NOTE — ED Notes (Signed)
Patient in dayroom resting. No signs of acute distress noted at this time. Maintained on 15 minute checks and observation by security camera for safety.

## 2016-07-18 NOTE — ED Notes (Signed)
Patient currently in room resting. No signs of distress noted. Maintained on 15 minute checks and observation by security camera for safety.  

## 2016-07-18 NOTE — Progress Notes (Signed)
CSW sent referral to [name]. CSW attempted referral for [name], however, was not able to get a fax number because no one was available to answer the phone. [phone] [name]([name] director) CSW called to follow up on [name] [phone] [name]([name]) No answer, left a message.  CSW will continue to look for placement for pt.  Jonathon JordanLynn B [name], MSW, Theresia MajorsLCSWA [phone]

## 2016-07-18 NOTE — NC FL2 (Signed)
[place] MEDICAID FL2 LEVEL OF CARE SCREENING TOOL     IDENTIFICATION  Patient Name: Kyle KiefBrandon Schippers Birthdate: 04/05/1996 Sex: male Admission Date (Current Location): 07/11/2016  [place]  [place] [id number] T Facility and Address:  [facility], [address], [place], [place] [address]      Provider Number: [id number]  Attending Physician Name and Address:  No att. providers found  Relative Name and Phone Number:  Reece LeaderRosemarie Storrs [phone]    Current Level of Care: Hospital Recommended Level of Care: Family Care Home Prior Approval Number:    Date Approved/Denied:   PASRR Number:    Discharge Plan: Other (Comment) (Family Care Home/ Group Home)    Current Diagnoses: Patient Active Problem List   Diagnosis Date Noted  . Self-inflicted laceration of wrist 07/12/2016  . Borderline personality disorder 07/12/2016  . GERD (gastroesophageal reflux disease) 06/02/2016  . Tobacco use disorder 06/02/2016  . Asthma 06/02/2016  . Suicidal ideation 06/01/2016  . Personality disorder 06/01/2016  . Bipolar disorder (HCC) 06/01/2016    Orientation RESPIRATION BLADDER Height & Weight     Self, Time, Situation, Place  Normal Continent Weight: 213 lb (96.6 kg) Height:  5\' 10"  (177.8 cm)  BEHAVIORAL SYMPTOMS/MOOD NEUROLOGICAL BOWEL NUTRITION STATUS  Other (Comment) (Has a history of mental health concerns. Pt can be self-injurious when upset but this behavior is preventable with the correct support in place.)   Continent Diet (Regular)  AMBULATORY STATUS COMMUNICATION OF NEEDS Skin   Independent Verbally Normal                       Personal Care Assistance Level of Assistance  Bathing, Feeding, Dressing Bathing Assistance: Independent Feeding assistance: Independent Dressing Assistance: Independent     Functional Limitations Info  Sight, Hearing, Speech Sight Info: Adequate Hearing Info: Adequate Speech  Info: Adequate    SPECIAL CARE FACTORS FREQUENCY                       Contractures Contractures Info: Not present    Additional Factors Info  Code Status, Allergies Code Status Info: Full Code Allergies Info: No known allergies            Current Medications (07/18/2016):  This is the current hospital active medication list Current Facility-Administered Medications  Medication Dose Route Frequency Provider Last Rate Last Dose  . divalproex (DEPAKOTE) DR tablet 500 mg  500 mg Oral Q12H Audery AmelJohn T Clapacs, MD   500 mg at 07/18/16 1035  . escitalopram (LEXAPRO) tablet 20 mg  20 mg Oral Daily Audery AmelJohn T Clapacs, MD   20 mg at 07/18/16 1035  . OLANZapine (ZYPREXA) tablet 20 mg  20 mg Oral QHS Audery AmelJohn T Clapacs, MD   20 mg at 07/17/16 2103  . OLANZapine (ZYPREXA) tablet 5 mg  5 mg Oral Daily Audery AmelJohn T Clapacs, MD   5 mg at 07/18/16 1035   Current Outpatient Prescriptions  Medication Sig Dispense Refill  . albuterol (PROVENTIL HFA;VENTOLIN HFA) 108 (90 Base) MCG/ACT inhaler Inhale 2 puffs into the lungs every 6 (six) hours as needed for wheezing or shortness of breath.    . clonazePAM (KLONOPIN) 0.5 MG tablet Take 1 tablet (0.5 mg total) by mouth 3 (three) times daily. 90 tablet 0  . divalproex (DEPAKOTE ER) 500 MG 24 hr tablet Take 500 mg by mouth 2 (two) times daily.     Marland Kitchen. escitalopram (LEXAPRO) 20 MG tablet Take 1  tablet (20 mg total) by mouth daily. 30 tablet 0  . ibuprofen (ADVIL,MOTRIN) 800 MG tablet Take 1 tablet (800 mg total) by mouth every 8 (eight) hours as needed. 30 tablet 0  . lactobacillus acidophilus (BACID) TABS tablet Take 1 tablet by mouth daily.    . Melatonin 3 MG TABS Take 3 mg by mouth at bedtime.    Marland Kitchen OLANZapine (ZYPREXA) 20 MG tablet Take 20 mg by mouth at bedtime.    Marland Kitchen OLANZapine (ZYPREXA) 5 MG tablet Take 5 mg by mouth every morning.    Marland Kitchen omeprazole (PRILOSEC) 40 MG capsule Take 40 mg by mouth every evening.       Discharge Medications: Please see discharge  summary for a list of discharge medications.  Relevant Imaging Results:  Relevant Lab Results:   Additional Information SSN: [id number]  [name], LCSWA

## 2016-07-18 NOTE — ED Notes (Signed)
Patient currently in dayroom talking with mother on the phone. No signs of distress noted at this time. Maintained on 15 minute checks and observation by security camera for safety.

## 2016-07-18 NOTE — ED Notes (Signed)
Patient denies SI/HI/AVH and pain. Patient says that he slept well and that he is anxious to be discharged. Patient was reminded to call family care homes to see if there is a bed available. Patient voiced understanding. Will continue to monitor. Maintained on 15 minute checks and observation by security camera for safety.

## 2016-07-18 NOTE — ED Notes (Signed)
Patient asleep in room. No noted distress or abnormal behavior. Will continue 15 minute checks and observation by security cameras for safety. 

## 2016-07-18 NOTE — ED Notes (Signed)

## 2016-07-18 NOTE — ED Notes (Signed)
Sandwich and soft drink given.  

## 2016-07-18 NOTE — ED Notes (Signed)
Patient currently in dayroom resting. No signs of distress noted. Maintained on 15 minute checks and observation by security camera for safety.  

## 2016-07-18 NOTE — Consult Note (Signed)
  Psychiatry: Patient interviewed. Completely stable. No new behavior problems. No aggression no suicidality. Patient is here awaiting placement.

## 2016-07-18 NOTE — ED Notes (Signed)
ED BHU PLACEMENT JUSTIFICATION Is the patient under IVC or is there intent for IVC: No. Is the patient medically cleared: Yes.   Is there vacancy in the ED BHU: Yes.   Is the population mix appropriate for patient: Yes.   Is the patient awaiting placement in inpatient or outpatient setting: Yes.   Has the patient had a psychiatric consult: Yes.   Survey of unit performed for contraband, proper placement and condition of furniture, tampering with fixtures in bathroom, shower, and each patient room: Yes.  ; Findings: NA APPEARANCE/BEHAVIOR calm, cooperative and adequate rapport can be established NEURO ASSESSMENT Orientation: time, place and person Hallucinations: No.None noted (Hallucinations) Speech: Normal Gait: normal RESPIRATORY ASSESSMENT Normal expansion.  Clear to auscultation.  No rales, rhonchi, or wheezing. CARDIOVASCULAR ASSESSMENT regular rate and rhythm, S1, S2 normal, no murmur, click, rub or gallop GASTROINTESTINAL ASSESSMENT soft, nontender, BS WNL, no r/g EXTREMITIES normal strength, tone, and muscle mass PLAN OF CARE Provide calm/safe environment. Vital signs assessed twice daily. ED BHU Assessment once each 12-hour shift. Collaborate with intake RN daily or as condition indicates. Assure the ED provider has rounded once each shift. Provide and encourage hygiene. Provide redirection as needed. Assess for escalating behavior; address immediately and inform ED provider.  Assess family dynamic and appropriateness for visitation as needed: Yes.  ; If necessary, describe findings: NA Educate the patient/family about BHU procedures/visitation: Yes.  ; If necessary, describe findings: NA  

## 2016-07-18 NOTE — ED Notes (Signed)
Patient currently in dayroom calling different group homes to determine if they have bed availability. No signs of distress noted at this time. Maintained on 15 minute checks and observation by security camera for safety.

## 2016-07-18 NOTE — ED Notes (Signed)
Patient currently in dayroom engaging in diversional activities. Patient shows no signs of distress at this time. Maintained on 15 minute checks and observation by security camera for safety.

## 2016-07-18 NOTE — ED Notes (Signed)
Pt. Alert and oriented, warm and dry, in no distress. Pt. Denies SI, HI, and AVH. Pt states, "I called a bunch of group homes today and I found some with open beds." Pt. Encouraged to let nursing staff know of any concerns or needs.

## 2016-07-18 NOTE — ED Notes (Signed)
ENVIRONMENTAL ASSESSMENT Potentially harmful objects out of patient reach: Yes Personal belongings secured: Yes Patient dressed in hospital provided attire only: Yes Plastic bags out of patient reach: Yes Patient care equipment (cords, cables, call bells, lines, and drains) shortened, removed, or accounted for: Yes Equipment and supplies removed from bottom of stretcher: Yes Potentially toxic materials out of patient reach: Yes Sharps container removed or out of patient reach: Yes  Patient currently in room sleeping. No signs of distress noted. Maintained on 15 minute checks and observation by security camera for safety.  

## 2016-07-19 NOTE — ED Notes (Signed)
Patient asleep in room. No noted distress or abnormal behavior. Will continue 15 minute checks and observation by security cameras for safety. 

## 2016-07-19 NOTE — ED Notes (Signed)
Patient currently in dayroom resting. No signs of distress noted at this time. Maintained on 15 minute checks and observation by security camera for safety.  

## 2016-07-19 NOTE — ED Notes (Signed)
ED BHU PLACEMENT JUSTIFICATION Is the patient under IVC or is there intent for IVC: No. Is the patient medically cleared: Yes.   Is there vacancy in the ED BHU: Yes.   Is the population mix appropriate for patient: Yes.   Is the patient awaiting placement in inpatient or outpatient setting: Yes.   Has the patient had a psychiatric consult: Yes.   Survey of unit performed for contraband, proper placement and condition of furniture, tampering with fixtures in bathroom, shower, and each patient room: Yes.  ; Findings: NA APPEARANCE/BEHAVIOR calm, cooperative and adequate rapport can be established NEURO ASSESSMENT Orientation: time, place and person Hallucinations: No.None noted (Hallucinations) Speech: Normal Gait: normal RESPIRATORY ASSESSMENT Normal expansion.  Clear to auscultation.  No rales, rhonchi, or wheezing. CARDIOVASCULAR ASSESSMENT regular rate and rhythm, S1, S2 normal, no murmur, click, rub or gallop GASTROINTESTINAL ASSESSMENT soft, nontender, BS WNL, no r/g EXTREMITIES normal strength, tone, and muscle mass PLAN OF CARE Provide calm/safe environment. Vital signs assessed twice daily. ED BHU Assessment once each 12-hour shift. Collaborate with intake RN daily or as condition indicates. Assure the ED provider has rounded once each shift. Provide and encourage hygiene. Provide redirection as needed. Assess for escalating behavior; address immediately and inform ED provider.  Assess family dynamic and appropriateness for visitation as needed: Yes.  ; If necessary, describe findings: NA Educate the patient/family about BHU procedures/visitation: Yes.  ; If necessary, describe findings: NA  

## 2016-07-19 NOTE — Progress Notes (Signed)
CSW consulted with her supervisor about pt's case. CSW was informed that Kyle Wang from [facility] may be a good placement option for pt. CSW called Kyle Wang at [phone]. No answer, and voicemail box is full. CSW will try again later.   CSW called [facility] at [phone] to see if patient had been assigned a care coordinator. Pt's [facility] Care Coordinator is Kyle Wang.   CSW called Kyle Wang at [phone]. No answer, left a message. Awaiting call back.  CSW called pt's legal guardian (mother) Kyle Wang [phone] and updated her on pt's status. CSW also provided her with [facility] care coordinator's contact information.   CSW will continue to look for placement for pt.  Kyle Wang, Kyle Wang, Kyle Wang [phone]

## 2016-07-19 NOTE — ED Provider Notes (Signed)
-----------------------------------------   7:33 AM on 07/19/2016 -----------------------------------------   Blood pressure (!) 99/50, pulse (!) 54, temperature 97.6 F (36.4 C), temperature source Oral, resp. rate 16, height 5\' 10"  (1.778 m), weight 213 lb (96.6 kg), SpO2 98 %.  The patient had no acute events since last update.  Calm and cooperative at this time.  Disposition is pending clinical social work team recommendations.     Irean HongJade J Marny Smethers, MD 07/19/16 [phone]

## 2016-07-19 NOTE — Progress Notes (Signed)
Pt has been [facility]. [facility] [phone] to follow up on referral. Adminstration is still reviewing pt's case. CSW will continue to look for placement.  CSW spoke with pt and informed him of above.  Jonathon JordanLynn B Sarya Linenberger, MSW, Theresia MajorsLCSWA [phone]

## 2016-07-19 NOTE — ED Notes (Signed)
Patient resting quietly in room. No noted distress or abnormal behaviors noted. Will continue 15 minute checks and observation by security camera for safety. 

## 2016-07-19 NOTE — ED Notes (Signed)
Patient currently in dayroom relaxing. No signs of distress noted. Maintained on 15 minute checks and observation by security camera for safety.

## 2016-07-19 NOTE — ED Notes (Signed)
Patient in dayroom playing cards with MHT and security.

## 2016-07-19 NOTE — ED Notes (Signed)
Patient currently in dayroom playing. No signs of distress noted. Maintained on 15 minute checks and observation by security camera for safety.

## 2016-07-19 NOTE — ED Notes (Signed)
Pt. Alert and oriented, warm and dry, in no distress. Pt. Denies SI, HI, and AVH. Pt. Encouraged to let nursing staff know of any concerns or needs. 

## 2016-07-19 NOTE — Progress Notes (Signed)
CSW called Lesle ChrisDerek McDowell with [facility] [phone] to follow up on pt referral. CSW further explained case and Francee PiccoloDerek states that he may be interested in coming to see pt and complete an assessment. Francee PiccoloDerek requested additional information which CSW sent shortly after conversation via fax number [phone]. Francee PiccoloDerek will call CSW back to arrange a time for him to see pt tomorrow.   Pt was informed of above.  Jonathon JordanLynn B Diahann Guajardo, MSW, Theresia MajorsLCSWA [phone]

## 2016-07-19 NOTE — ED Notes (Signed)

## 2016-07-19 NOTE — ED Notes (Signed)
Patient in dayroom watching television. No signs of distress noted. Maintained on 15 minute checks and observation by security camera for safety.  

## 2016-07-19 NOTE — ED Notes (Signed)

## 2016-07-19 NOTE — ED Notes (Signed)
Patient in dayroom watching television. No signs of acute distress noted at this time. Maintained on 15 minute checks and observation by security camera for safety.  

## 2016-07-19 NOTE — ED Notes (Signed)
Patient denies SI/HI/AVH and pain. Patient is excited hear an update about a possible group home placement today. Patient is calm and cooperative, no signs of acute distress noted. Maintained on 15 minute checks and observation by security camera for safety.

## 2016-07-20 NOTE — Progress Notes (Signed)
CSW called Kyle Wang from [facility] [phone] three times at various times today. No answer each time. The voicemail box is full so CSW is unable to leave a message. CSW will continue to try Kyle PiccoloDerek throughout the day.  CSW sent an additional referral to [name].  P: [phone] F: [phone]   Kyle Wang, Kyle Wang, Kyle Wang [phone]

## 2016-07-20 NOTE — ED Notes (Signed)
VOL/Pending Placement 

## 2016-07-20 NOTE — ED Provider Notes (Signed)
-----------------------------------------   7:01 AM on 07/20/2016 -----------------------------------------   Blood pressure (!) 114/55, pulse 60, temperature 98 F (36.7 C), temperature source Oral, resp. rate 16, height 5\' 10"  (1.778 m), weight 96.6 kg, SpO2 98 %.  The patient had no acute events since last update.  Calm and cooperative at this time.  Disposition is pending CSW recommendations.     Loleta Roseory Barrie Wale, MD 07/20/16 [phone]

## 2016-07-21 NOTE — ED Notes (Signed)
Patient noted in room. No complaints, stable, in no acute distress. Q15 minute rounds and monitoring via Security Cameras to continue.  

## 2016-07-21 NOTE — ED Notes (Signed)

## 2016-07-21 NOTE — Progress Notes (Addendum)
CSW called [facility] [phone] to make a referral. No answer, left a message.  CSW made a referral [facility] P: [phone] F: [phone]  CSW called [facility] [phone]. No answer, left a message.  [facility]W called [facility] [phone]. No male beds.  CSW called [facility] [phone]. No answer, voicemail has not been set up yet.  [facility] [phone]. No male beds available.   CSW called [facility] [phone]. No answer, left a message.   CSW called [facility] at [phone]. No answer, voicemail is not set up.  CSW will continue to look for placement for pt.  Kyle JordanLynn B Sarabelle Wang, MSW, Theresia MajorsLCSWA [phone]

## 2016-07-21 NOTE — Progress Notes (Signed)
CSW called Francee PiccoloDerek from [facility] [phone] to follow up on pt referral. No answer, left a message.  [facility] [phone] to follow up on pt referral. No answer, voicemail box is currently full.  CSW will continue to look for placement for pt.  Jonathon JordanLynn B Taygan Connell, MSW, Theresia MajorsLCSWA [phone]

## 2016-07-21 NOTE — Progress Notes (Addendum)
CSW met with pt and pt's mother earlier today to discuss pt's discharge plan. CSW explained that she continues to look for placement for pt and listed all the places that CSW is waiting to hear back from. Pt's mother explained that she has been in contact with pt's care coordinator Salome Holmes) and he mentioned that he would be attempting to get enhanced services for pt so that he is better supported in his next placement.   After meeting with pt and pt's mother, CSW called Salome Holmes ([place]) [phone]. Aaron Edelman informed CSW that he has put pt on a wait list for residential services provided by [facility] states that he will be out of the office tomorrow but encouraged CSW to call him on Tuesday and update him on any changes with pt placement. CSW agreed.   Update: Salome Holmes called CSW again and stated that he was able to get in touch with Vicente Males from [facility]. [facility] is still reviewing pt's case to be sure that pt will be properly supported at his facility. Vicente Males is also interested in possibly coming to visit pt tomorrow 9/1. Weekend CSW notified.   [name], MSW, [place]

## 2016-07-21 NOTE — ED Notes (Signed)
Patient stated during conversation "I do not want to hurt myself any longer"

## 2016-07-21 NOTE — ED Notes (Signed)
[facility] due to potential exposure to another violent patient.

## 2016-07-22 NOTE — ED Notes (Signed)
BEHAVIORAL HEALTH ROUNDING Patient sleeping: Yes.   Patient alert and oriented: eyes closed  Appears to be asleep Behavior appropriate: Yes.  ; If no, describe:  Nutrition and fluids offered: Yes  Toileting and hygiene offered: sleeping Sitter present: q 15 minute observations and security monitoring Law enforcement present: yes  ODS 

## 2016-07-22 NOTE — ED Notes (Signed)
ED BHU PLACEMENT JUSTIFICATION Is the patient under IVC or is there intent for IVC: Yes.   Is the patient medically cleared: Yes.   Is there vacancy in the ED BHU: Yes.   Is the population mix appropriate for patient: Yes.   Is the patient awaiting placement in inpatient or outpatient setting: Yes.   Has the patient had a psychiatric consult: Yes.   Survey of unit performed for contraband, proper placement and condition of furniture, tampering with fixtures in bathroom, shower, and each patient room: Yes.  ; Findings:  APPEARANCE/BEHAVIOR Calm and cooperative NEURO ASSESSMENT Orientation: oriented x3  Denies pain Hallucinations: No.None noted (Hallucinations) Speech: Normal Gait: normal RESPIRATORY ASSESSMENT Even  Unlabored respirations  CARDIOVASCULAR ASSESSMENT Pulses equal   regular rate  Skin warm and dry   GASTROINTESTINAL ASSESSMENT no GI complaint EXTREMITIES Full ROM  PLAN OF CARE Provide calm/safe environment. Vital signs assessed twice daily. ED BHU Assessment once each 12-hour shift. Collaborate with TTS  daily or as condition indicates. Assure the ED provider has rounded once each shift. Provide and encourage hygiene. Provide redirection as needed. Assess for escalating behavior; address immediately and inform ED provider.  Assess family dynamic and appropriateness for visitation as needed: Yes.  ; If necessary, describe findings:  Educate the patient/family about BHU procedures/visitation: Yes.  ; If necessary, describe findings:   

## 2016-07-22 NOTE — Progress Notes (Signed)
LCSW met with patient. He showed LCSW all the group homes he has called to see if they would take hime. He reports he has been very stable at the hospital for the last week. LCSW explained that Vicente Males or Group home employee may come for an interview at 3pm. Patient reports he is happy about that. He also reports he understands he cant act out anymore as he is an adult. He had recent visits with his Mom and is looking forward to a new placement.  [employer] LCSW [phone]

## 2016-07-22 NOTE — ED Notes (Signed)
Nurse talked to Patient and He is bright, smiles, and talked about issues with His mom and how she adopted him and His twin sister when He was 20 years old, Patient states that His adopted mom was a good mother, but that she is controlling at times and that they argue. Patient states that she came to visit once and they got along well, Patient denies Si/hi and avh. q 15 min.checks and camera monitoring.

## 2016-07-22 NOTE — ED Notes (Signed)
BEHAVIORAL HEALTH ROUNDING Patient sleeping: No. Patient alert and oriented: yes Behavior appropriate: Yes.  ; If no, describe:  Nutrition and fluids offered: yes Toileting and hygiene offered: Yes  Sitter present: q15 minute observations and security  monitoring Law enforcement present: Yes  ODS  

## 2016-07-22 NOTE — ED Notes (Signed)

## 2016-07-22 NOTE — ED Notes (Signed)
Lunch was given to patient 

## 2016-07-22 NOTE — ED Provider Notes (Signed)
Vitals:   07/21/16 1442 07/22/16 0522  BP: 127/63 128/76  Pulse: 74 75  Resp: 16 16  Temp: 97.5 F (36.4 C)    Patient remains medically stable for psychiatric disposition.   Emily FilbertJonathan E Alisson Rozell, MD 07/22/16 [phone]

## 2016-07-22 NOTE — ED Notes (Signed)

## 2016-07-22 NOTE — ED Notes (Signed)
Patient observed lying in bed with eyes closed  Even, unlabored respirations observed   NAD pt appears to be sleeping  I will continue to monitor along with every 15 minute visual observations and ongoing security monitoring    

## 2016-07-22 NOTE — ED Notes (Signed)
Report was received from [name], RN; Pt. Verbalizes no complaints or distress; denies S.I./Hi. Continue to monitor with 15 min. Monitoring. 

## 2016-07-22 NOTE — ED Notes (Signed)
Patient in room sleeping.

## 2016-07-22 NOTE — ED Notes (Signed)
Patient transferred to room 5, Patient alert and oriented, Patient is safe, no signs of distress. q 15 min. Checks and camera monitoring in progress.

## 2016-07-22 NOTE — ED Notes (Signed)
BEHAVIORAL HEALTH ROUNDING Patient sleeping: Yes.   Patient alert and oriented: eyes closed  Appears to be asleep Behavior appropriate: Yes.  ; If no, describe:  Nutrition and fluids offered:  sleeping Toileting and hygiene offered: sleeping Sitter present: q 15 minute observations and security camera monitoring Law enforcement present: yes  ODS 

## 2016-07-22 NOTE — Consult Note (Signed)
  Psychiatry: Very brief follow-up for this 20 year old man with chronic mental health and behavior problems. Patient has continued to reside in the emergency room because of the lack of any appropriate disposition or treatment plan as an outpatient. Patient has managed to stay in good spirits and has been cooperative with treatment. We continue to work on discharge planning while he remained psychiatrically stable.

## 2016-07-22 NOTE — ED Notes (Signed)
Patient in shower at this time. 

## 2016-07-22 NOTE — Progress Notes (Signed)
LCSW called Group Home Provider Francee PiccoloDerek  From [facility] and he will come and see patient this afternoon at 3pm. [phone]  Called  And followed up with [facility]- Left message for Rema Jasmineamika Black  [phone]  Arlys JohnBrian From [facility] Coordinator-[phone] ( out all weekend).   [employer]audine Jonea Bukowski LCSW [phone]

## 2016-07-22 NOTE — Progress Notes (Signed)
Group Home provider running late due to other pts [facility]. LCSW notified patient.   [employer] LinesClaudine Anniah Glick [phone]

## 2016-07-23 NOTE — Progress Notes (Signed)
LCSW called patients guardian/mother and inquired if patient has a CCC yet. She stated she received a call on Thursday and Arlys JohnBrian from [facility] is now attached and will also look for placement. Reviewed the group homes available and mother has OK'd sending information to all group homes.  LCSW has called [facility]e [facility]col[facility]rek [facility] and he has not returned my calls this morning. [phone]  Phill MutterCalled [name] [phone] and he asked for LCSW call with details on Monday. May have a bed in a week.  Called  [facility]- [facility] worker they requested we fax information and [name] will consider patient on Monday. [phone]  Called [facility] Humphries and left a message. Awaiting a call back  [facility] - called No beds  [place] spoke to [facility] [phone] Male Beds only  [place] [phone]/Called and left message.    Called [facility] and left message for Quinby BlasBrian McCalister pts Care Coordinator  [phone] to call and follow up with other LCSW on Monday.

## 2016-07-23 NOTE — Progress Notes (Signed)
Tresea MallCalled Derek [phone] from [facility] and left a detailed message to contact this social worker ASAP.  [employer] LinesClaudine Arron Mcnaught LCSW [phone]

## 2016-07-23 NOTE — Progress Notes (Signed)
LCSW provided emotional support and direction to patient while disruption was occurring in the [facility]HH unit. Patient was asked to remain in their room. In follow up patient reports he is fine. No further needs.  Kyle Thoman LCSW [phone]  

## 2016-07-23 NOTE — ED Provider Notes (Signed)
-----------------------------------------   8:19 AM on 07/23/2016 -----------------------------------------   Blood pressure 120/67, pulse (!) 58, temperature 97.7 F (36.5 C), temperature source Oral, resp. rate 18, height 5\' 10"  (1.778 m), weight 213 lb (96.6 kg), SpO2 98 %.  The patient had no acute events since last update.  Calm and cooperative at this time.    Pending social work recommendations     Rebecka ApleyAllison P Webster, MD 07/23/16 [phone]

## 2016-07-23 NOTE — ED Notes (Signed)

## 2016-07-24 NOTE — ED Notes (Addendum)
Patent back on BHU unit, Patient without any issues when outside, He maintained being cooperative, Patient in dayroom, watching tv, no signs of distress. q 15 min. checks, camera surveillance at all times.

## 2016-07-24 NOTE — ED Notes (Signed)
Report was received from [name], RN; Pt. Verbalizes no complaints or distress; denies S.I./Hi. Continue to monitor with 15 min. Monitoring. 

## 2016-07-24 NOTE — ED Notes (Signed)
Pt is alert and oriented this evening. Pt is pleasant and cooperative, follows staff instructions and taking medications as prescribed. 15 minute checks are ongoing for safety.

## 2016-07-24 NOTE — ED Notes (Signed)
Patient is sitting in dayroom watching tv, no signs of distress, Patient is pleasant and calm, He is safe, q 15 min. Checks and camera surveillance in progress.

## 2016-07-24 NOTE — ED Notes (Addendum)
Patient has had extended stay in BHU, and requested to be able to go outside to get fresh air, patient did contract not to run, states that He will not try to leave" Patient is not a elopement risk. Patient was transferred via w/c to courtyard by dialysis unit with security guard and nurse.

## 2016-07-24 NOTE — Progress Notes (Signed)
LCSW called and left another message to Derek-outward bound and left message requesting call back.  LCSW called [name] and discussed patients information with guardians approval -pt will be considered and Patsy Baltimore will come to meet patient on Monday. LCSW  Will await a call back from [place] with a time he will visit.  Other options for this patient will be handed off to other LCSW for Monday follow up.  LCSW met with patient and discussed possible group home placement.    [employer] LCSW [phone]

## 2016-07-24 NOTE — ED Notes (Signed)
Patient alert and oriented, no signs of distress, Patient is playing with ball and watching tv, nurse went and bounced ball with him, Patient is pleasant, will continue to monitor. q 15 min. Checks.

## 2016-07-24 NOTE — ED Notes (Signed)

## 2016-07-24 NOTE — ED Notes (Signed)
Pt VOL/ Pending Placement  

## 2016-07-24 NOTE — ED Notes (Signed)
Break served and medications will be given as ordered. Patient alert and oriented, no signs of distress.

## 2016-07-24 NOTE — ED Provider Notes (Signed)
-----------------------------------------   6:16 AM on 07/24/2016 -----------------------------------------   Blood pressure 120/67, pulse (!) 58, temperature 97.7 F (36.5 C), temperature source Oral, resp. rate 18, height 5\' 10"  (1.778 m), weight 213 lb (96.6 kg), SpO2 98 %.  The patient had no acute events since last update.  Calm and cooperative at this time.  Awaiting appropriate placement for the patient.   Minna AntisKevin Elinor Kleine, MD 07/24/16 [phone]

## 2016-07-24 NOTE — ED Notes (Signed)
Patient is alert and oriented, watching tv, no signs of distress. q 15 min. Checks, and camera monitoring.

## 2016-07-24 NOTE — ED Notes (Signed)
Patient alert and oriented, no signs of distress, denies Si/hi or avh, q 15 min. Checks, and camera surveillance in progress.

## 2016-07-24 NOTE — ED Notes (Signed)
Patient is calm and cooperative, no signs of distress, Patient denies Si/Hi at this time, patient watching tv, q 15 min. Checks and camera monitoring.

## 2016-07-25 NOTE — ED Provider Notes (Signed)
-----------------------------------------   7:04 AM on 07/25/2016 -----------------------------------------   Blood pressure (!) 98/58, pulse 60, temperature 97.8 F (36.6 C), temperature source Oral, resp. rate 16, height 5\' 10"  (1.778 m), weight 213 lb (96.6 kg), SpO2 95 %.  The patient had no acute events since last update.  Calm and cooperative at this time.  Awaiting placement.    Irean HongJade J Tracia Lacomb, MD 07/25/16 [phone]

## 2016-07-25 NOTE — Progress Notes (Signed)
CSW called [facility] [phone] to follow up on pt's referral. No answer, left a message.   [facility] [phone] to follow up on pt's referral. Worker states that administration is still reviewing pt's case, but did express some concerns about pt not being a good fit for their group home. CSW advocated for pt and explained that pt has been stable for the time that he has remained in the hospital and stated that pt is motivated to make his next placement successful. CSW also encouraged worker to ask administrative staff to come visit pt in person so they can get a better clinical picture of pt. CSW will call back later this afternoon to follow up again.  Jonathon JordanLynn B Winnie Umali, MSW, Theresia MajorsLCSWA [phone]

## 2016-07-25 NOTE — Consult Note (Signed)
  Psychiatry: Brief note to confirm once again that the patient is completely psychiatrically stable and is not needing any kind of hospital level care. He remains in the emergency room entirely because of placement problems.

## 2016-07-25 NOTE — Progress Notes (Signed)
CSW called [facility] [phone] and spoke to U[place]iaQuinton to follow up on pt's referral. Mena GoesQuinton states that he did not receive the referral and requested that the information be sent again. CSW sent referral again via fax number [phone]. CSW called back to follow up on referral. No answer, left a message.  CS[facility] called [facility] [phone] to follow up on pt referral. Worker states that the administrator is still reviewing case and will call CSW back wih a decision. CSW again advocated for pt to be seen in person instead of only being reviewed by his file. CSW awaiting call back.  Jonathon JordanLynn B Azeneth Carbonell, MSW, Theresia MajorsLCSWA [phone]

## 2016-07-26 NOTE — ED Notes (Signed)
Pt is awake and alert this evening. Pt mood is happy and his affect is bright. Pt is taking medications as prescribed and is pleasant and cooperative with staff. 15 minute checks ongoing for safety.

## 2016-07-26 NOTE — ED Provider Notes (Signed)
-----------------------------------------   7:11 AM on 07/26/2016 -----------------------------------------   Blood pressure 109/65, pulse 76, temperature 98.1 F (36.7 C), temperature source Oral, resp. rate 18, height 5\' 10"  (1.778 m), weight 213 lb (96.6 kg), SpO2 95 %.  The patient had no acute events since last update.  Calm and cooperative at this time.    The patient is currently awaiting placement.     Rebecka ApleyAllison P Webster, MD 07/26/16 [phone]

## 2016-07-26 NOTE — Progress Notes (Signed)
CSW called [name] with [facility] [phone]. Mena GoesQuinton states that he did have time to review the information that was faxed over by CSW and he is interested in coming to visit the patient today around 10 or 10:30am. CSW will be present for this meeting.   Jonathon JordanLynn B Blaklee Shores, MSW, Theresia MajorsLCSWA [phone]

## 2016-07-26 NOTE — ED Notes (Signed)
Sandwich and soft drink given.  

## 2016-07-26 NOTE — Progress Notes (Signed)
Kyle Wang from [facility] came to visit pt and about 10:30a this morning. CSW was present at the time of the interview. Kyle Wang spoke with pt about the expectations at the group home and gave the pt the opportunity to ask questions. Kyle Wang also asked several questions about the pt's history. CSW advocated for the pt by pointing out the pt's strengths and recommending ways that the pt could be best supported in the group home. Kyle Wang is willing to consider pt for admission to the facility but would like some additional time to make a final decision. Kyle Wang stated that he would get back in contact with CSW no later than tomorrow 9/6 with a decision about the pt.  CSW called pt's mom/guaridan Kyle Wang [phone] and informed her of above.   CSW called [facility]'s [facility], Kyle Wang, [phone]. No answer, left a message.  Kyle Wang, MSW, Theresia MajorsLCSWA [phone]

## 2016-07-26 NOTE — ED Notes (Signed)
Pt status remains the same VOL /Pending placement

## 2016-07-27 NOTE — Progress Notes (Signed)
CSW called [name] with [facility] [phone]. CSW asked if he has made a decision yet about the pt. Mena GoesQuinton states that he is willing to give the pt a chance at his group home, however, he needs to sort out some administrative details first. Mena GoesQuinton stated that he may be able to get everything sorted out by today and give CSW a call with a final answer, but if not CSW should give him a call tomorrow. CSW expressed her understanding and will call [name] tomorrow for a decision unless he reaches out first.   CSW informed pt and pt's mother/guardian of above.  Jonathon JordanLynn B Terie Lear, MSW, Theresia MajorsLCSWA [phone]

## 2016-07-27 NOTE — ED Notes (Signed)
Patient currently in dayroom resting. No signs of distress noted at this time. Maintained on 15 minute checks and observation by security camera for safety.  

## 2016-07-27 NOTE — ED Notes (Signed)
Patient noted in room. No complaints, stable, in no acute distress. Q15 minute rounds and monitoring via Security Cameras to continue.  

## 2016-07-27 NOTE — ED Notes (Signed)
Patient in dayroom resting. No signs of acute distress noted. Maintained on 15 minute checks and observation by security camera for safety.  

## 2016-07-27 NOTE — ED Notes (Signed)
Patient resting in dayroom. No signs of acute distress noted. Maintained on 15 minute checks and observation by security camera for safety.

## 2016-07-27 NOTE — ED Notes (Signed)
Patient asleep in room. No noted distress or abnormal behavior. Will continue 15 minute checks and observation by security cameras for safety. 

## 2016-07-27 NOTE — ED Notes (Signed)
ENVIRONMENTAL ASSESSMENT Potentially harmful objects out of patient reach: Yes Personal belongings secured: Yes Patient dressed in hospital provided attire only: Yes Plastic bags out of patient reach: Yes Patient care equipment (cords, cables, call bells, lines, and drains) shortened, removed, or accounted for: Yes Equipment and supplies removed from bottom of stretcher: Yes Potentially toxic materials out of patient reach: Yes Sharps container removed or out of patient reach: Yes  Patient currently in room sleeping. No signs of distress noted. Maintained on 15 minute checks and observation by security camera for safety.  

## 2016-07-27 NOTE — ED Notes (Signed)
Patient stated to nursing staff that he was starting to get anxious to leave. Patient acknowledged that he is bored and just wants to have different scenery. Nurse talked with patient and explained that social work was working diligently to find placement for him. Patient voiced understanding. Maintained on 15 minute checks and observation by security camera for safety.

## 2016-07-27 NOTE — ED Notes (Signed)
Patient denies SI/HI/AVH and pain. Patient is calm and cooperative. Will continue to monitor. Continues to await placement. Maintained on 15 minute checks and observation by security camera for safety.

## 2016-07-27 NOTE — ED Provider Notes (Signed)
-----------------------------------------   6:50 AM on 07/27/2016 -----------------------------------------   Blood pressure 115/69, pulse 60, temperature 97.7 F (36.5 C), temperature source Oral, resp. rate 16, height 5\' 10"  (1.778 m), weight 213 lb (96.6 kg), SpO2 97 %.  The patient had no acute events since last update.  Calm and cooperative at this time.  Disposition is pending clinical social work team recommendations.     Irean HongJade J Annaston Upham, MD 07/27/16 [phone]

## 2016-07-28 NOTE — ED Notes (Signed)
VOL/Pending Placement 

## 2016-07-28 NOTE — ED Notes (Signed)
Pt is alert and oriented this evening. Pt mood is appropriate but he is somewhat anxious. Pt is calm and cooperative with staff and 15 minute checks are ongoing for safety. Denies SI/HI and AVH.

## 2016-07-28 NOTE — ED Notes (Signed)
Report was received from LuAnn C., RN; Pt. Verbalizes no complaints or distress; denies S.I./Hi. Continue to monitor with 15 min. Monitoring. 

## 2016-07-28 NOTE — Progress Notes (Signed)
CSW called Kyle Wang from [facility] [phone] for an update on pt's referral. Kyle Wang states that he is in a meeting and requested to be called back around 1pm. CSW agreed.  [facility] [phone]  to follow up on pt referral. No answer, left a message. CSW awaiting call back.  Kyle Wang, MSW, Kyle Wang [phone]

## 2016-07-28 NOTE — ED Provider Notes (Signed)
-----------------------------------------   6:25 AM on 07/28/2016 -----------------------------------------   Blood pressure (!) 106/56, pulse (!) 57, temperature 97.6 F (36.4 C), temperature source Oral, resp. rate 18, height 5\' 10"  (1.778 m), weight 213 lb (96.6 kg), SpO2 97 %.  The patient had no acute events since last update.  Calm and cooperative at this time.  Disposition is pending Psychiatry/Behavioral Medicine team recommendations.     Irean HongJade J Sung, MD 07/28/16 [phone]

## 2016-07-29 NOTE — ED Notes (Signed)
Patient watching tv, no signs of distress., q . Checks, and camera monitoring in progress.

## 2016-07-29 NOTE — ED Notes (Signed)
Patient noted in room. No complaints, stable, in no acute distress. Q15 minute rounds and monitoring via Security Cameras to continue.  

## 2016-07-29 NOTE — ED Notes (Signed)
Patient is sitting in dayroom, no signs of distress, watching tv, denies Si/hi or avh, q 15 min. Checks, and camera monitoring in progress.

## 2016-07-29 NOTE — ED Notes (Signed)
Patient has been in the mileu.  He is compliant with his medication.  Patient states that he is being placed in a group home and once a room becomes available, he can be transferred.  He is pleasant and calm; his behavior is appropriate.  He denies any thoughts of self harm.  He denies AVH/HI.  Continue to assess and completed hourly rounds.

## 2016-07-29 NOTE — ED Notes (Signed)
Patient is in dayroom, He is bright, smiling, no signs of distress, patient is watching tv, He is safe, denies Si/hi or avh.

## 2016-07-29 NOTE — Progress Notes (Signed)
LCSW faxed over [facility] to [facility] spoke to [name] [phone] Patient is being considered.  LCSW spoke to Larkin InaErica Jones from [facility] not suitable for their home patients are 40+   [employer] LinesClaudine Adrain Nesbit LCSW [phone]

## 2016-07-29 NOTE — ED Provider Notes (Signed)
-----------------------------------------   7:20 AM on 07/29/2016 -----------------------------------------   Blood pressure 112/64, pulse (!) 56, temperature 97.5 F (36.4 C), temperature source Oral, resp. rate 18, height 5\' 10"  (1.778 m), weight 213 lb (96.6 kg), SpO2 98 %.  The patient had no acute events since last update.  Calm and cooperative at this time.  Disposition is pending clinical social work team recommendations.     Irean HongJade J Amberlynn Tempesta, MD 07/29/16 [phone]

## 2016-07-29 NOTE — ED Notes (Signed)
Patient's supper tray was served, Patient without evidence of stress. Patient is calm and cooperative.

## 2016-07-29 NOTE — ED Notes (Signed)

## 2016-07-30 NOTE — ED Notes (Signed)
Patient noted in room. No complaints, stable, in no acute distress. Q15 minute rounds and monitoring via Security Cameras to continue.  

## 2016-07-30 NOTE — ED Provider Notes (Signed)
-----------------------------------------   7:20 AM on 07/30/2016 -----------------------------------------   Blood pressure 114/67, pulse 64, temperature 97.6 F (36.4 C), temperature source Oral, resp. rate 18, height 5\' 10"  (1.778 m), weight 213 lb (96.6 kg), SpO2 99 %.  The patient had no acute events since last update.  Calm and cooperative at this time.  Disposition is pending Psychiatry/Behavioral Medicine team recommendations.     Gayla DossEryka A Jesyca Weisenburger, MD 07/30/16 [phone]

## 2016-07-30 NOTE — Progress Notes (Signed)
LCSW met with patient and discussed possible placements. This worker called Pilar Grammes and [employer] for potential placement opportunity and is awaiting a call back.  LCSW called patients guardian and is awaitng a call back  [place] from [facility] [phone] fax- They are having a staff meeting and reviewing patient records and will call us back.

## 2016-07-30 NOTE — ED Notes (Signed)
Patient is dayroom, patient alert and oriented, playing ball, watching tv, interacts well with staff and other Patients, Patient is cooperative and pleasant, no danger to himself or others.

## 2016-07-30 NOTE — ED Notes (Signed)
Patient had lunch, Patient is oriented, no signs of distress, Patient watching tv in dayroom, camera monitoring in progress, and nurse to monitor.

## 2016-07-30 NOTE — ED Notes (Signed)
Patient is sleeping, no signs of distress, q 15 minute checks and camera surveillance in progress.

## 2016-07-30 NOTE — ED Notes (Signed)
Patient is in dayroom, no signs of distress, He is bright, denies Si/hi or avh. Patient with camera surveillance in progress.

## 2016-07-30 NOTE — Progress Notes (Signed)
[facility] and regrettably this patient was declined. After staff meeting and current group dynamics of the facility  would be better served with a male resident.    [employer] LinesClaudine Wayde Gopaul LCSW [phone]

## 2016-07-30 NOTE — ED Notes (Signed)
Supper served. Patient remains calm, q 15 minutes checks and camera monitoring in progress.

## 2016-07-30 NOTE — ED Notes (Signed)
Patient has been sleeping, easily awakens, Patient denies Si/hi or avh, breakfast served. Camera surveillance in progress.

## 2016-07-30 NOTE — Progress Notes (Signed)
LCSW received called  Guardian Mrs Lorri Frederickoeppleman [phone] and she reported she had a lengthy discussion with [facility] Arlys JohnBrian from [facility] and reported he didn't offer a lot of support.  LCSW reviewed the 3 potential group home providers and weekday LCSW will follow up as follows.  Follow up with [facility] Care Coordinator Court JoyBrian McAllister    [name]- [facility] ( Monday[facility], awaiting call back [facility] [name] [phone], awaiting call back today.   [employer] LinesClaudine Coleby Yett LCSW [phone]

## 2016-07-31 NOTE — ED Notes (Signed)
Patient noted in room. No complaints, stable, in no acute distress. Q15 minute rounds and monitoring via Security Cameras to continue.  

## 2016-07-31 NOTE — ED Notes (Signed)
Patient has been calm and cooperative throughout shift. He hopes he will be able to go to a new group home soon.  Maintained on 15 minute checks and observation by security camera for safety.

## 2016-07-31 NOTE — ED Notes (Signed)
Patient received meal tray and beverage. 

## 2016-07-31 NOTE — ED Notes (Signed)
Patient taking a shower. Maintained on 15 minute checks and observation by security camera for safety. 

## 2016-07-31 NOTE — ED Notes (Signed)
Report was received from [name], RN; Pt. Verbalizes no complaints or distress; denies S.I./Hi. Continue to monitor with 15 min. Monitoring. 

## 2016-07-31 NOTE — ED Notes (Signed)
Patient received dinner tray 

## 2016-07-31 NOTE — ED Notes (Signed)

## 2016-07-31 NOTE — ED Notes (Signed)
Patient awake briefly to take medications. No complaints.Maintained on 15 minute checks and observation by security camera for safety.

## 2016-07-31 NOTE — ED Notes (Signed)
Patient noted in day room. No complaints, stable, in no acute distress. Q15 minute rounds and monitoring via Security Cameras to continue. 

## 2016-07-31 NOTE — ED Notes (Signed)
Patient asleep in room. No noted distress or abnormal behavior. Will continue 15 minute checks and observation by security cameras for safety. 

## 2016-07-31 NOTE — ED Notes (Signed)

## 2016-07-31 NOTE — Progress Notes (Signed)
LCSW engaged with patient and explained we are continuing to work with his mother ( guardian) and Arlys JohnBrian his Care Coordinator to find him suitable housing awaiting a decision from [place] [facility] and have contacted another facility.  LCSW called [facility] Philips/ [facility] AmisLuw[facility] Ray  [phone]

## 2016-07-31 NOTE — ED Notes (Signed)
VOL  PENDING  PLACEMENT 

## 2016-07-31 NOTE — ED Notes (Signed)
Patient asleep in room. No noted distress or abnormal behavior. Will continue 15 minute checks and observation by security cameras for safety. Breakfast tray left at bedside. 

## 2016-07-31 NOTE — ED Notes (Signed)
Patient keeping self occupied on the unit. Mood is euthymic, congruent affect. No thoughts to cut self. Maintained on 15 minute checks and observation by security camera for safety.

## 2016-07-31 NOTE — ED Notes (Signed)
Patient in dayroom, interacting with staff. Voices no complaints.  Maintained on 15 minute checks and observation by security camera for safety.

## 2016-08-01 MED ORDER — ONDANSETRON 4 MG PO TBDP
4.0000 mg | ORAL_TABLET | Freq: Once | ORAL | Status: AC
  Administered 2016-08-01: 4 mg via ORAL
  Filled 2016-08-01: qty 1

## 2016-08-01 NOTE — ED Notes (Signed)
Patient currently in dayroom resting. No signs of distress noted at this time. Maintained on 15 minute checks and observation by security camera for safety.

## 2016-08-01 NOTE — ED Notes (Signed)
ENVIRONMENTAL ASSESSMENT Potentially harmful objects out of patient reach: Yes Personal belongings secured: Yes Patient dressed in hospital provided attire only: Yes Plastic bags out of patient reach: Yes Patient care equipment (cords, cables, call bells, lines, and drains) shortened, removed, or accounted for: Yes Equipment and supplies removed from bottom of stretcher: Yes Potentially toxic materials out of patient reach: Yes Sharps container removed or out of patient reach: Yes  Patient is currently in room sleeping. No signs of distress noted. Maintained on 15 minute checks and observation by security camera for safety.  

## 2016-08-01 NOTE — ED Provider Notes (Signed)
-----------------------------------------   7:02 AM on 08/01/2016 -----------------------------------------   Blood pressure (!) 109/54, pulse 66, temperature 97.6 F (36.4 C), temperature source Oral, resp. rate 18, height 5\' 10"  (1.778 m), weight 213 lb (96.6 kg), SpO2 95 %.  The patient had no acute events since last update.  Calm and cooperative at this time.  Disposition is pending Psychiatry/Behavioral Medicine team recommendations.     Sharman CheekPhillip Makenzee Choudhry, MD 08/01/16 [phone]

## 2016-08-01 NOTE — ED Notes (Signed)
Patient asleep in room. No noted distress or abnormal behavior. Will continue 15 minute checks and observation by security cameras for safety. 

## 2016-08-01 NOTE — ED Notes (Signed)
Patient in dayroom socializing. No signs of distress noted. Maintained on 15 minute checks and observation by security camera for safety.

## 2016-08-01 NOTE — ED Notes (Signed)
Patient denies SI/HI/AVH and pain. Remains calm and cooperative. No signs of acute distress noted. Maintained on 15 minute checks and observation by security camera for safety.

## 2016-08-01 NOTE — ED Notes (Signed)
Patient currently in room resting and watching television. No signs of acute distress at this time. Maintained on 15 minute checks and observation by security camera for safety.

## 2016-08-01 NOTE — ED Notes (Signed)
Patient taking a shower at this time.  

## 2016-08-01 NOTE — ED Notes (Signed)
Patient expressed anxiety related to whether or not the group home will accept him today. He states that he has been here for 3 weeks and that he shouldn't have acted out in the group home so he wouldn't have to be here now. Nurse provided support to patient, and stated that he would know about any updates. No signs of distress noted at this time. Maintained on 15 minute checks and observation by security camera for safety.

## 2016-08-01 NOTE — Progress Notes (Signed)
CSW called [name] from [facility] [phone] to follow up on pt's referral. Mena GoesQuinton states that he is still trying to work out the administrative details and requested that CSW call him back tomorrow. CSW agreed.  CSW called Kyle Wang group home director at [phone]. No answer, CSW left a message.  CSW spoke with pt's [name] [phone] and informed her of above. Pt's mother expressed her understanding and requested to be kept updated on pt's status. CSW agreed.   CSW called [facility]'s [facility], [name], [phone]. No answer, left a message.  Jonathon JordanLynn B Emad Brechtel, MSW, Theresia MajorsLCSWA [phone]

## 2016-08-01 NOTE — ED Notes (Signed)
Patient currently in dayroom resting and watching television. No signs of acute distress noted at this time. Maintained on 15 minute checks and observation by security camera for safety.

## 2016-08-01 NOTE — ED Notes (Signed)
Patient noted in day room. No complaints, stable, in no acute distress. Q15 minute rounds and monitoring via Security Cameras to continue. 

## 2016-08-01 NOTE — ED Notes (Signed)
Patient in dayroom watching television. No signs of acute distress noted. Maintained on 15 minute checks and observation by security camera for safety.  

## 2016-08-02 NOTE — ED Notes (Signed)
Pt is alert and oriented this evening. Pt mood is appropriate and he is pleasant and cooperative with staff. Pt denies SI/HI and AVH at this time. Writer provided food and drink, discussed tx plan and 15 minute checks are ongoing for safety. Pt expressed interest in changing his guardian from mother to DSS. Writer encouraged pt to discuss with social worker to determine his options.

## 2016-08-02 NOTE — ED Notes (Signed)
Patient noted in room. No complaints, stable, in no acute distress. Q15 minute rounds and monitoring via Security Cameras to continue.  

## 2016-08-02 NOTE — ED Provider Notes (Signed)
-----------------------------------------   7:29 AM on 08/02/2016 -----------------------------------------   Blood pressure (!) 109/56, pulse 65, temperature 97.7 F (36.5 C), temperature source Oral, resp. rate 16, height 5\' 10"  (1.778 m), weight 213 lb (96.6 kg), SpO2 94 %.  The patient had no acute events since last update.  Calm and cooperative at this time.  Disposition is pending  social work recommendations and patient acceptance to a facility.     Rebecka ApleyAllison P Webster, MD 08/02/16 0730

## 2016-08-02 NOTE — Progress Notes (Signed)
CSW called Mena GoesQuinton ([facility]) [phone] for an update on pt's referral. Mena GoesQuinton states that he is still having trouble discharging his other clients. For this reason Mena GoesQuinton does not expect there to be an opening until Oct. 8th. CSW thanked [place] for his time.  CSW called [facility] to follow up on referral [phone] Burna Mortimer(Wanda). Burna MortimerWanda states that she never received a fax over the weekend. CSW refaxed the information to fax # [phone].   CSW sent an additional referral to [facility].  P: [phone] F: [phone]   CSW will follow up on both referrals later today.  Jonathon JordanLynn B Yalexa Blust, MSW, Theresia MajorsLCSWA [phone]

## 2016-08-02 NOTE — Progress Notes (Signed)
CSW called [facility] [phone] Mariann Laster). Mariann Laster is still reviewing pt's case and will call back later this afternoon. CSW awaiting call back.  CSW called [facility] [phone]. Worker confirmed that the fax was received. However, the administrative staff is out for the day and will not return until tomorrow. CSW will call back tomorrow.   CSW called pt's mother, Kalman Shan [phone] to inform her of above. Rose expressed her understanding and requested to be kept updated on pt's status. CSW agreed.  CSW called pt's [facility], Salome Holmes, [phone]. No answer, left a message. CSW awaiting call back.  CSW met with pt and informed him of above. Pt expressed feelings of sadness and when asked, pt rated those feelings as a 6/10. Pt also currently denies SI/HI and states that he will let staff know if he starts to have SI or HI. CSW provided emotional support and supportive counseling. CSW thanked pt for his continued patience and encouraged pt to reach out to CSW anytime he feels he needs to talk. Pt was very appreciative and thanked CSW for her time.   [name], MSW, [place]

## 2016-08-02 NOTE — ED Notes (Signed)
Patient noted in day room. No complaints, stable, in no acute distress. Q15 minute rounds and monitoring via Security Cameras to continue. 

## 2016-08-02 NOTE — ED Notes (Signed)
Pt  Vol pending  placement 

## 2016-08-03 NOTE — Progress Notes (Signed)
[facility].  Had long discussion with Group [facility]:  [facility] Burna MortimerWanda.  Burna MortimerWanda was updated regarding history of patient and reasons for admission. Burna MortimerWanda will be coming to hospital tomorrow afternoon to see patient and see if appropriate.   Call placed to [facility], called Misty StanleyLisa regarding referral and when she could come and see patient.  She reports she will call back this afternoon as she is going into the doctor's office currently.    Deretha EmoryHannah Bao Bazen LCSW, MSW Clinical Social Work: Optician, dispensingystem [place]

## 2016-08-03 NOTE — ED Notes (Signed)
Pt woken up earlier for meds he is alert and oriented and offered no c/o , I advised him to try and get up shortly

## 2016-08-03 NOTE — Progress Notes (Signed)
CSW called Mariann Laster from [facility] [phone]. Mariann Laster states that she has received the fax and would like to review pt's case with her coworker Pilar Grammes. Mariann Laster states that their home in [place] will have an opening for a male bed on 9/15. CSW will follow up later this afternoon.  CSW met with Dan Maker from [facility] located in [place], [place]. [place] reviewed pt's case with Lattie Haw and asked Lattie Haw to speak to the pt. Lattie Haw stated that she needed to leave the hospital soon but could return later this afternoon to see the patient. CSW will contact her this afternoon, via phone number [phone], to confirm what time she will be available to meet with pt.   CSW called [facility] [phone] to follow up on pt's referral. Worker states that the fax was actually not received. CSW resent fax to fax # [phone]. CSW called and worker confirmed that the fax was in fact received this time around. [facility] is reviewing pt's case. CSW will follow up on referral later this afternoon.  [name], MSW, [place]

## 2016-08-03 NOTE — Progress Notes (Signed)
CSW called Salome Holmes, [facility] coordinator, [phone]. Aaron Edelman states that he has sent referrals for pt to several different group homes but has not had any luck so far. CSW updated Aaron Edelman on pt's status and let him know that Dan Maker is interested in seeing pt today. Aaron Edelman states he will continue to look for placement for pt and keep CSW updated.  CSW called pt's mother/legal guardian Tullio Chausse [phone] and informed her of above. CSW also let Ms. Poeppleman know that referrals from [facility] and [facility] are currently pending.  CSW met with the pt and informed him of the updates to his status. Pt expressed his gratitude and is aware that Lattie Haw from [place] is interested in visiting with him today.  [name], MSW, [place]

## 2016-08-03 NOTE — ED Notes (Signed)
Pt is alert and oriented this evening watching TV in dayroom. Pt denies SI/HI and AVH at this time. Pt is pleasant and cooperative with staff. 15 minute checks are ongoing for safety.

## 2016-08-04 NOTE — ED Notes (Signed)
Patient cooperative with blood draw

## 2016-08-04 NOTE — ED Notes (Signed)

## 2016-08-04 NOTE — ED Notes (Signed)
Report was received from Amy H., RN Pt. Verbalizes no complaints or distress; denies S.I./Hi. Pt. Stating, "I'm leaving on Monday to a new group home; I'm glad." Continue to monitor with 15 min. Monitoring.

## 2016-08-04 NOTE — ED Notes (Signed)

## 2016-08-04 NOTE — ED Notes (Signed)
Call placed to lab for collection tubes. Alerted charge nurse of need for blood draw by ED staff.

## 2016-08-04 NOTE — ED Notes (Signed)
Patient awake, alert, and oriented.  Given meal tray and breakfast.

## 2016-08-04 NOTE — ED Notes (Signed)
Patient took a shower. Patient anxious to meet with group home managers today regarding potential placement.  Maintained on 15 minute checks and observation by security camera for safety.

## 2016-08-04 NOTE — Progress Notes (Signed)
CSW called Burna MortimerWanda from [facility] [phone]  to schedule a time for her to come visit pt. No answer, left a message. CSW awaiting call back.  CSW contacted Tami LinLisa Rogers from [facility] [phone]. Misty StanleyLisa states that she will visit pt today but needs to wait until her other staff arrives before she can leave. Misty StanleyLisa will call CSW when she is on the way to the hospital.  CSW called [facility] [phone]. Worker states that the group home Interior and spatial designerdirector, Mr. Humphries, will not be back in the office until Friday or Monday to make a decision about pt's case. CSW expressed her understanding and will notify weekend CSW to follow up tomorrow.   Jonathon JordanLynn B Sheana Bir, MSW, Theresia MajorsLCSWA [phone]

## 2016-08-04 NOTE — Progress Notes (Signed)
CSW met with Kyle Wang and Kyle Wang from [facility]. Kyle Wang introduced herself to the Kyle Wang and gave the Kyle Wang an opportunity to ask any questions that he may have about the group home. CSW advocated for the Kyle Wang by speaking about his strengths and highlighting ways in which the Kyle Wang could be best supported in the home. Kyle Wang stated that she feels Kyle Wang will be a good fit for her home and she will have a bed available on Monday 9/18. Kyle Wang is requesting that Kyle Wang receive a TB test before being d/c'd into her care. CSW notified EDP about TB test.   Estimated d/c date 9/18. However, CSW will continue to follow up with the other group home options as well as a back-up plan.  CSW called Kyle Wang from [facility] at [phone]. Kyle Wang states that the discharge for one of her current residents is taking longer than expected and because of this she will not have an available bed until sometime next week. Kyle Wang is still interested in meeting with the Kyle Wang ans reports that she will be able to come tomorrow. This CSW will notify weekend CSW.   CSW called Kyle Wang's mom/guardian Kyle Wang [phone] and informed her of above.  [name], MSW, [place]

## 2016-08-04 NOTE — ED Notes (Signed)
Patient met with group home manager and Education officer, museum. He is excited about possible discharge early next week to group home.

## 2016-08-04 NOTE — ED Provider Notes (Signed)
-----------------------------------------   3:19 PM on 08/04/2016 -----------------------------------------  The case manager called me to let me know that a group home has been found, but they require a negative TB test before accepting the patient.  I have ordered the Quantiferon TB Gold assay to meet this requirement.   ----------------------------------------- 12:19 AM on 08/05/2016 -----------------------------------------  It occurred to me that the Quantiferon test result may not be back by the time the patient needs to be placed in the group home.  The lab is checking with [facility] to determine when the result will be back, but in the meantime I ordered (and spoke by phone with the [facility] in the [facility]) a PPD placed.  That way it can be read this weekend if the Quantiferon result is not back in time.   Loleta Roseory Male Minish, MD 08/05/16 [phone]

## 2016-08-04 NOTE — ED Notes (Signed)
Patient received lunch tray and beverage. 

## 2016-08-04 NOTE — ED Notes (Signed)
Maintained on 15 minute checks and observation by security camera for safety. 

## 2016-08-04 NOTE — ED Notes (Signed)
Patient received dinner tray and beverage.

## 2016-08-04 NOTE — ED Notes (Signed)
Patient in dayroom.  Maintained on 15 minute checks and observation by security camera for safety.

## 2016-08-04 NOTE — ED Notes (Signed)
Patient sitting in dayroom, watching television. Talking with another patient. Mood is cheerful with bright affect. Maintained on 15 minute checks and observation by security camera for safety.

## 2016-08-04 NOTE — ED Provider Notes (Signed)
-----------------------------------------   6:42 AM on 08/04/2016 -----------------------------------------   Blood pressure 106/66, pulse (!) 54, temperature 97.7 F (36.5 C), temperature source Oral, resp. rate 18, height 5\' 10"  (1.778 m), weight 213 lb (96.6 kg), SpO2 99 %.  The patient had no acute events since last update.  Calm and cooperative at this time.  Disposition is pending social work team recommendations.     Irean HongJade J Sung, MD 08/04/16 [phone]

## 2016-08-04 NOTE — ED Notes (Signed)
Patient continues in good behavioral control, social, good mood. Awaiting group home visitors.

## 2016-08-05 MED ORDER — TUBERCULIN PPD 5 UNIT/0.1ML ID SOLN
5.0000 [IU] | Freq: Once | INTRADERMAL | Status: AC
  Administered 2016-08-05: 5 [IU] via INTRADERMAL
  Filled 2016-08-05: qty 0.1

## 2016-08-05 NOTE — ED Notes (Signed)
Patient denies SI/HI/AVH and pain. Patient denies depression at this time but endorses anxiety related to going to a new group home on Monday stating that he is worried the other group home members will not like him. Patient stated that he would may be interviewed by another group home today but he states he is disinterested in attending this one. Patient is calm and cooperative with nursing interventions. Maintained on 15 minute checks and observation by security camera for safety.

## 2016-08-05 NOTE — ED Provider Notes (Signed)
-----------------------------------------   9:02 PM on 08/05/2016 -----------------------------------------  POINT OF CLARIFICATION REGARDING TB TESTING:  As per my note on 9/14, I ordered a Quantiferon TB test earlier in the day on 9/14.  [facility], but I verified by phone with Pharmacy on 9/15 at approx 00:30 that unfortunately the results will not be back until Monday at the earliest, and possibly Tuesday, which would be too late for ideal group home placement on Monday.  Therefore I ordered a PPD test and called the nurse in the BHU at about 00:30 on 9/15 to let her know that the PPD test needed to be placed ASAP.  I just reviewed the records and it appears that Pharmacy verified the order shortly after I placed it, but if I am interpreting the notes correctly, it appears that the PPD was not actually placed until about 13:30 on 9/15.  However, this should NOT delay disposition, because the results may be interpreted by a nurse or provider on the afternoon or evening of Sunday, 9/17, which should be sufficient for disposition on Monday.  **Note to Nursing!!** Please make sure that the PPD is interpreted and documented on the afternoon or evening of Sunday, 9/17, so that the patient may be transferred to the group home on Monday.   Loleta Roseory Mahdiya Mossberg, MD 08/05/16 2109

## 2016-08-05 NOTE — Progress Notes (Signed)
LCSW has spoken to group home for transfer on Monday. Lattie Haw is in agreement with Kyle Wang coming on Monday if the following are complete:   FL2 TB screen test.  LCSW has completed Fl2.  Awaiting MD signature TB test has been placed.  Will be read this weekend.  No other needs at this time. LCSW met with Kyle Wang, discussed plan and offered support during transition. Reports he is in agreement with new group home. Reports he knows two of the guys in the group home because they go to his day treatment.  No other needs voiced at this time. Kyle Wang is calm, cooperative, and engaged.  Lane Hacker, MSW Clinical Social Work: Printmaker

## 2016-08-05 NOTE — ED Notes (Signed)
Patient is currently in dayroom resting. No signs of acute distress noted. Maintained on 15 minute checks and observation by security camera for safety.

## 2016-08-05 NOTE — Consult Note (Signed)
Psychiatry: Follow-up for this 20 year old man who has been waiting in the emergency room for a long time for appropriate placement. I am told there is a possibility that he might have a disposition that would be worked out after the weekend. Patient continues to have no behavior problems no complaints no acute psychiatric needs. No change to medication or treatment. FL to completed.

## 2016-08-05 NOTE — ED Provider Notes (Addendum)
-----------------------------------------   12:32 PM on 08/05/2016 -----------------------------------------   Blood pressure (!) 103/57, pulse (!) 52, temperature 97.9 F (36.6 C), temperature source Oral, resp. rate 20, height 5\' 10"  (1.778 m), weight 213 lb (96.6 kg), SpO2 99 %.  The patient had no acute events since last update.  Calm and cooperative at this time.  Disposition is pending Psychiatry/Behavioral Medicine team recommendations.  Placement has been located but still awaiting results of quantiferon test.   Willy EddyPatrick Jaiyana Canale, MD 08/05/16 1232    Willy EddyPatrick Niaja Stickley, MD 08/05/16 [phone]

## 2016-08-05 NOTE — ED Notes (Signed)
ENVIRONMENTAL ASSESSMENT  Potentially harmful objects out of patient reach: Yes.  Personal belongings secured: Yes.  Patient dressed in hospital provided attire only: Yes.  Plastic bags out of patient reach: Yes.  Patient care equipment (cords, cables, call bells, lines, and drains) shortened, removed, or accounted for: Yes.  Equipment and supplies removed from bottom of stretcher: Yes.  Potentially toxic materials out of patient reach: Yes.  Sharps container removed or out of patient reach: Yes.   BEHAVIORAL HEALTH ROUNDING  Patient sleeping: No.  Patient alert and oriented: yes  Behavior appropriate: Yes. ; If no, describe:  Nutrition and fluids offered: Yes  Toileting and hygiene offered: Yes  Sitter present: not applicable, Q 15 min safety rounds and observation via security camera. Law enforcement present: Yes ODS  ED BHU PLACEMENT JUSTIFICATION  Is the patient under IVC or is there intent for IVC: Yes.  Is the patient medically cleared: Yes.  Is there vacancy in the ED BHU: Yes.  Is the population mix appropriate for patient: Yes.  Is the patient awaiting placement in inpatient or outpatient setting: Yes. Pending group home placement.  Has the patient had a psychiatric consult: Yes.  Survey of unit performed for contraband, proper placement and condition of furniture, tampering with fixtures in bathroom, shower, and each patient room: Yes. ; Findings: All clear  APPEARANCE/BEHAVIOR  calm, cooperative and adequate rapport can be established  NEURO ASSESSMENT  Orientation: time, place and person  Hallucinations: No.None noted (Hallucinations)  Speech: Normal  Gait: normal  RESPIRATORY ASSESSMENT  WNL  CARDIOVASCULAR ASSESSMENT  WNL  GASTROINTESTINAL ASSESSMENT  WNL  EXTREMITIES  WNL  PLAN OF CARE  Provide calm/safe environment. Vital signs assessed twice daily. ED BHU Assessment once each 12-hour shift. Collaborate with TTS daily or as condition indicates. Assure the ED  provider has rounded once each shift. Provide and encourage hygiene. Provide redirection as needed. Assess for escalating behavior; address immediately and inform ED provider.  Assess family dynamic and appropriateness for visitation as needed: Yes. ; If necessary, describe findings:  Educate the patient/family about BHU procedures/visitation: Yes. ; If necessary, describe findings: Pt is calm and cooperative at this time. Pt understanding and accepting of unit procedures/rules. Will continue to monitor with Q 15 min safety rounds and observation via security camera.

## 2016-08-05 NOTE — ED Notes (Signed)
Patient asleep in room. No noted distress or abnormal behavior. Will continue 15 minute checks and observation by security cameras for safety. 

## 2016-08-05 NOTE — ED Notes (Signed)
Patient in dayroom resting quietly. No noted distress or abnormal behaviors noted. Will continue 15 minute checks and observation by security camera for safety.

## 2016-08-05 NOTE — ED Notes (Signed)
Patient currently in dayroom watching television. No signs of distress noted. Maintained on 15 minute checks and observation by security camera for safety.  

## 2016-08-05 NOTE — ED Notes (Signed)
ENVIRONMENTAL ASSESSMENT Potentially harmful objects out of patient reach: Yes Personal belongings secured: Yes Patient dressed in hospital provided attire only: Yes Plastic bags out of patient reach: Yes Patient care equipment (cords, cables, call bells, lines, and drains) shortened, removed, or accounted for: Yes Equipment and supplies removed from bottom of stretcher: Yes Potentially toxic materials out of patient reach: Yes Sharps container removed or out of patient reach: Yes  Patient in room sleeping. No signs of acute distress noted. Maintained on 15 minute checks and observation by security camera for safety.

## 2016-08-05 NOTE — ED Notes (Signed)
Patient currently in dayroom resting. No signs of acute distress noted. Maintained on 15 minute checks and observation by security camera for safety.  

## 2016-08-05 NOTE — ED Notes (Signed)
BEHAVIORAL HEALTH ROUNDING  Patient sleeping: No.  Patient alert and oriented: yes  Behavior appropriate: Yes. ; If no, describe:  Nutrition and fluids offered: Yes  Toileting and hygiene offered: Yes  Sitter present: not applicable, Q 15 min safety rounds and observation via security camera. Law enforcement present: Yes ODS  

## 2016-08-05 NOTE — ED Notes (Signed)
Patient received TB injection without any incident. No signs of distress noted. Maintained on 15 minute checks and observation by security camera for safety.

## 2016-08-05 NOTE — ED Notes (Signed)
Patient took a shower.

## 2016-08-05 NOTE — NC FL2 (Signed)
[place] MEDICAID FL2 LEVEL OF CARE SCREENING TOOL     IDENTIFICATION  Patient Name: Kyle KiefBrandon Basham Birthdate: 04/05/1996 Sex: male Admission Date (Current Location): 07/11/2016  [place]  [place] [id number] T Facility and Address:  [facility], [address], [place], [place] [address]      Provider Number: [id number]  Attending Physician Name and Address:  No att. providers found  Relative Name and Phone Number:  Reece LeaderRosemarie Phung [phone]    Current Level of Care: Hospital Recommended Level of Care: Family Care Home Prior Approval Number:    Date Approved/Denied:   PASRR Number:    Discharge Plan: Other (Comment) (Group Home/Family Care Home)    Current Diagnoses: Patient Active Problem List   Diagnosis Date Noted  . Self-inflicted laceration of wrist 07/12/2016  . Borderline personality disorder 07/12/2016  . GERD (gastroesophageal reflux disease) 06/02/2016  . Tobacco use disorder 06/02/2016  . Asthma 06/02/2016  . Suicidal ideation 06/01/2016  . Personality disorder 06/01/2016  . Bipolar disorder (HCC) 06/01/2016    Orientation RESPIRATION BLADDER Height & Weight     Self, Time, Situation, Place  Normal Continent Weight: 213 lb (96.6 kg) Height:  5\' 10"  (177.8 cm)  BEHAVIORAL SYMPTOMS/MOOD NEUROLOGICAL BOWEL NUTRITION STATUS  Other (Comment) (Has a history of mental health concerns. Pt can be self-injurious when upset but this behavior is preventable with the correct support in place.)   Continent Diet (regular diet)  AMBULATORY STATUS COMMUNICATION OF NEEDS Skin   Independent Verbally Normal                       Personal Care Assistance Level of Assistance  Bathing, Feeding, Dressing Bathing Assistance: Independent Feeding assistance: Independent Dressing Assistance: Independent     Functional Limitations Info  Sight, Hearing, Speech Sight Info: Adequate Hearing Info: Adequate Speech  Info: Adequate    SPECIAL CARE FACTORS FREQUENCY                       Contractures Contractures Info: Not present    Additional Factors Info  Code Status, Allergies Code Status Info: Full Code Allergies Info: NKA           Current Medications (08/05/2016):  This is the current hospital active medication list Current Facility-Administered Medications  Medication Dose Route Frequency Provider Last Rate Last Dose  . divalproex (DEPAKOTE) DR tablet 500 mg  500 mg Oral Q12H Audery AmelJohn T Clapacs, MD   500 mg at 08/05/16 1025  . escitalopram (LEXAPRO) tablet 20 mg  20 mg Oral Daily Audery AmelJohn T Clapacs, MD   20 mg at 08/05/16 1025  . OLANZapine (ZYPREXA) tablet 20 mg  20 mg Oral QHS Audery AmelJohn T Clapacs, MD   20 mg at 08/04/16 2121  . OLANZapine (ZYPREXA) tablet 5 mg  5 mg Oral Daily Audery AmelJohn T Clapacs, MD   5 mg at 08/05/16 1025  . tuberculin injection 5 Units  5 Units Intradermal Once Loleta Roseory Forbach, MD   5 Units at 08/05/16 1334   Current Outpatient Prescriptions  Medication Sig Dispense Refill  . albuterol (PROVENTIL HFA;VENTOLIN HFA) 108 (90 Base) MCG/ACT inhaler Inhale 2 puffs into the lungs every 6 (six) hours as needed for wheezing or shortness of breath.    . clonazePAM (KLONOPIN) 0.5 MG tablet Take 0.5 mg by mouth 3 (three) times daily as needed for anxiety.    . divalproex (DEPAKOTE ER) 500 MG 24 hr tablet Take 500 mg  by mouth 2 (two) times daily.     Marland Kitchen escitalopram (LEXAPRO) 20 MG tablet Take 1 tablet (20 mg total) by mouth daily. 30 tablet 0  . ibuprofen (ADVIL,MOTRIN) 800 MG tablet Take 1 tablet (800 mg total) by mouth every 8 (eight) hours as needed. 30 tablet 0  . lactobacillus acidophilus (BACID) TABS tablet Take 1 tablet by mouth daily.    . Melatonin 3 MG TABS Take 3 mg by mouth at bedtime.    Marland Kitchen OLANZapine (ZYPREXA) 20 MG tablet Take 20 mg by mouth at bedtime.    Marland Kitchen OLANZapine (ZYPREXA) 5 MG tablet Take 5 mg by mouth every morning.    Marland Kitchen omeprazole (PRILOSEC) 40 MG capsule Take 40 mg  by mouth every evening.    . clonazePAM (KLONOPIN) 0.5 MG tablet Take 1 tablet (0.5 mg total) by mouth 3 (three) times daily. 90 tablet 0     Discharge Medications: Please see discharge summary for a list of discharge medications.  Relevant Imaging Results:  Relevant Lab Results:   Additional Information SSN:  [id number]     Raye Sorrow, [place]

## 2016-08-05 NOTE — ED Notes (Signed)
Patient is currently dayroom watching television. No signs of acute distress noted at this time. Maintained on 15 minute checks and observation by security camera for safety.

## 2016-08-06 NOTE — ED Notes (Signed)
BEHAVIORAL HEALTH ROUNDING  Patient sleeping: No.  Patient alert and oriented: yes  Behavior appropriate: Yes. ; If no, describe:  Nutrition and fluids offered: Yes  Toileting and hygiene offered: Yes  Sitter present: not applicable, Q 15 min safety rounds and observation via security camera. Law enforcement present: Yes ODS  

## 2016-08-06 NOTE — ED Notes (Signed)
BEHAVIORAL HEALTH ROUNDING Patient sleeping: Yes.   Patient alert and oriented: not applicable SLEEPING Behavior appropriate: Yes.  ; If no, describe: SLEEPING Nutrition and fluids offered: No SLEEPING Toileting and hygiene offered: NoSLEEPING Sitter present: not applicable, Q 15 min safety rounds and observation via security camera. Law enforcement present: Yes ODS 

## 2016-08-06 NOTE — ED Provider Notes (Signed)
-----------------------------------------   6:31 AM on 08/06/2016 -----------------------------------------   Blood pressure 137/83, pulse 63, temperature 98.9 F (37.2 C), temperature source Oral, resp. rate 20, height 5\' 10"  (1.778 m), weight 213 lb (96.6 kg), SpO2 100 %.  The patient had no acute events since last update.  Calm and cooperative at this time.  Disposition is pending Psychiatry/Behavioral Medicine team recommendations.     Jennye MoccasinBrian S Meia Emley, MD 08/06/16 [phone]

## 2016-08-07 MED ORDER — DIPHENHYDRAMINE HCL 25 MG PO CAPS
50.0000 mg | ORAL_CAPSULE | Freq: Every day | ORAL | Status: DC
Start: 2016-08-07 — End: 2016-08-08
  Administered 2016-08-07: 50 mg via ORAL
  Filled 2016-08-07: qty 2

## 2016-08-07 NOTE — ED Notes (Signed)
Awake and alert in dayroom. No acute distress noted. Calm and cooperative. Safety maintained with every 15 minute checks, security officer, and camera in place. Will continue to monitor.

## 2016-08-07 NOTE — ED Notes (Signed)
Awake and alert in dayroom. Requesting a medication to help him sleep tonight because he is anxious about possible discharge tomorrow. Calm and cooperative with no acute distress noted. Safety maintained with every 15 minute checks, Engineer, materialssecurity officer and cameras in place. Will continue to monitor.

## 2016-08-07 NOTE — ED Notes (Signed)
TB skin test administered 08/05/2016 1334 [facility]. TB skin test read 08/07/2016 1345. Negative.

## 2016-08-07 NOTE — ED Notes (Signed)
Patient is voluntary and is pending placement. 

## 2016-08-07 NOTE — ED Notes (Signed)
Awake and alert in dayroom eating dinner. Food and fluids provided. Calm and cooperative with no acute distress noted. Safety maintained with every 15 minute checks, Engineer, materialssecurity officer and cameras in place. Will continue to monitor.

## 2016-08-07 NOTE — ED Notes (Signed)
Awake and alert in dayroom with no acute distress noted. Calm and cooperative. Safety maintained with every 15 minute checks, security officer and cameras in place. Will continue to monitor.  

## 2016-08-07 NOTE — ED Notes (Signed)
Resting in room with no acute distress noted. Calm and cooperative. Safety maintained with every 15 minute checks, Engineer, materialssecurity officer and cameras in place. Will continue to monitor.

## 2016-08-07 NOTE — ED Notes (Signed)
PT VOLUNTARY STILL PENDING PLACEMENT.

## 2016-08-07 NOTE — ED Notes (Signed)
Awake and alert in room eating breakfast. Food and fluids provided. No acute distress noted. Calm and cooperative. Safety maintained with every 15 minute checks. Engineer, materialsecurity officer and cameras in place. Will continue to monitor.

## 2016-08-07 NOTE — ED Notes (Signed)
Awake and alert in dayroom interacting appropriately with peers with no acute distress noted. Calm and cooperative. Safety maintained with every 15 minute checks, Engineer, materialssecurity officer and cameras in place. Will continue to monitor.

## 2016-08-07 NOTE — ED Notes (Signed)
Awake and alert in room with TV on. Complaint with medication administration. No complaints at this time. No acute distress noted. Safety maintained with every 15 minute checks, security officer and camera in place. Will continue to monitor.

## 2016-08-07 NOTE — ED Provider Notes (Signed)
-----------------------------------------   6:42 AM on 08/07/2016 -----------------------------------------   Blood pressure 105/61, pulse 62, temperature 98 F (36.7 C), temperature source Oral, resp. rate 18, height 5\' 10"  (1.778 m), weight 213 lb (96.6 kg), SpO2 98 %.  The patient had no acute events since last update.  Calm and cooperative at this time.  Disposition is pending Psychiatry/Behavioral Medicine team recommendations.     Jennye MoccasinBrian S Derika Eckles, MD 08/07/16 [phone]

## 2016-08-07 NOTE — ED Notes (Signed)
Awake and alert in dayroom socializing appropriately with peers.Calm and cooperative. No acute distress noted. Safety maintained with every 15 minute checks, Engineer, materialssecurity officer, and cameras in place. Will continue to monitor.

## 2016-08-07 NOTE — ED Notes (Signed)
PT VOLUNTARY STILL PENDING PLACEMENT. 

## 2016-08-07 NOTE — ED Notes (Signed)
TB skin test results printed and placed on chart.

## 2016-08-07 NOTE — ED Notes (Signed)
Awake and alert in dayroom with no acute distress noted. Calm and cooperative. Safety maintained with every 15 minute checks, Engineer, materialssecurity officer and cameras in place. Will continue to monitor.

## 2016-08-08 LAB — QUANTIFERON TB GOLD ASSAY (BLOOD)

## 2016-08-08 LAB — QUANTIFERON IN TUBE
QFT TB AG MINUS NIL VALUE: 0.07 [IU]/mL
QUANTIFERON MITOGEN VALUE: 6.84 [IU]/mL
QUANTIFERON TB AG VALUE: 0.14 [IU]/mL
QUANTIFERON TB GOLD: NEGATIVE
Quantiferon Nil Value: 0.07 [IU]/mL

## 2016-08-08 MED ORDER — OLANZAPINE 5 MG PO TABS: 5.0000 mg | ORAL_TABLET | Freq: Every day | ORAL | 1 refills | Status: DC

## 2016-08-08 MED ORDER — DIVALPROEX SODIUM 500 MG PO DR TAB: 500.0000 mg | DELAYED_RELEASE_TABLET | Freq: Two times a day (BID) | ORAL | 1 refills | Status: DC

## 2016-08-08 MED ORDER — OLANZAPINE 20 MG PO TABS: 20.0000 mg | ORAL_TABLET | Freq: Every day | ORAL | 1 refills | Status: DC

## 2016-08-08 MED ORDER — ESCITALOPRAM OXALATE 20 MG PO TABS: 20.0000 mg | ORAL_TABLET | Freq: Every day | ORAL | 1 refills | Status: DC

## 2016-08-08 MED ORDER — DIPHENHYDRAMINE HCL 50 MG PO CAPS: 50.0000 mg | ORAL_CAPSULE | Freq: Every day | ORAL | 1 refills | Status: DC

## 2016-08-08 NOTE — Consult Note (Signed)
Psychiatry: Patient is being discharged today to a group home. He is aware of the plan and is agreeable. He has had no new complaints or behavior problems. Physically stable. Patient is not under IVC. Emergency room physician and is handling the discharge order. I have printed out prescriptions for all of his current medications for 30 days plus a refill.

## 2016-08-08 NOTE — ED Notes (Signed)
Pt is alert and oriented this evening watching TV in the common area. Pt is eager to be discharged to his new group home tomorrow, but is somewhat anxious. Pt continues to be pleasant and cooperative with staff. 15 minute checks are ongoing for safety.

## 2016-08-08 NOTE — ED Provider Notes (Signed)
-----------------------------------------   8:18 AM on 08/08/2016 -----------------------------------------   Blood pressure (!) 106/52, pulse (!) 55, temperature 97.9 F (36.6 C), temperature source Oral, resp. rate 18, height 5\' 10"  (1.778 m), weight 213 lb (96.6 kg), SpO2 97 %.  The patient had no acute events since last update.  Calm and cooperative at this time.  Disposition is pending per Psychiatry/Behavioral Medicine team recommendations.     Nita Sicklearolina Jaisen Wiltrout, MD 08/08/16 [phone]

## 2016-08-08 NOTE — ED Provider Notes (Signed)
-----------------------------------------   12:51 PM on 08/08/2016 -----------------------------------------   Blood pressure (!) 106/52, pulse (!) 55, temperature 97.9 F (36.6 C), temperature source Oral, resp. rate 18, height 5\' 10"  (1.778 m), weight 213 lb (96.6 kg), SpO2 97 %.  Patient evaluated by Dr. Toni Amendlapacs and deemed safe for discharge. His group home will take him back. Labs and vital signs within normal limits.   Nita Sicklearolina Ranada Vigorito, MD 08/08/16 1251

## 2016-08-08 NOTE — ED Notes (Signed)
sta ff from group home here to pick up patient for dc, they were given  hard copy rx plus  I faxed rx to pharmacy per staff request ( I got confirmation they received it) pt is very excited to be going and his behavior remains appropriate

## 2016-08-11 ENCOUNTER — Emergency Department: Payer: BLUE CROSS/BLUE SHIELD

## 2016-08-11 ENCOUNTER — Emergency Department
Admission: EM | Admit: 2016-08-11 | Discharge: 2016-08-11 | Disposition: A | Discharge status: Home / Self Care | Payer: BLUE CROSS/BLUE SHIELD | Attending: Emergency Medicine | Admitting: Emergency Medicine

## 2016-08-11 ENCOUNTER — Encounter: Payer: Self-pay | Admitting: Emergency Medicine

## 2016-08-11 DIAGNOSIS — J45909 Unspecified asthma, uncomplicated: Secondary | ICD-10-CM | POA: Diagnosis not present

## 2016-08-11 DIAGNOSIS — Z79899 Other long term (current) drug therapy: Secondary | ICD-10-CM | POA: Diagnosis not present

## 2016-08-11 DIAGNOSIS — Y999 Unspecified external cause status: Secondary | ICD-10-CM | POA: Insufficient documentation

## 2016-08-11 DIAGNOSIS — Z87891 Personal history of nicotine dependence: Secondary | ICD-10-CM | POA: Diagnosis not present

## 2016-08-11 DIAGNOSIS — Y9389 Activity, other specified: Secondary | ICD-10-CM | POA: Diagnosis not present

## 2016-08-11 DIAGNOSIS — Y92410 Unspecified street and highway as the place of occurrence of the external cause: Secondary | ICD-10-CM | POA: Insufficient documentation

## 2016-08-11 DIAGNOSIS — S8002XA Contusion of left knee, initial encounter: Secondary | ICD-10-CM | POA: Diagnosis not present

## 2016-08-11 DIAGNOSIS — M25562 Pain in left knee: Secondary | ICD-10-CM | POA: Diagnosis present

## 2016-08-11 MED ORDER — IBUPROFEN 600 MG PO TABS
600.0000 mg | ORAL_TABLET | Freq: Once | ORAL | Status: AC
  Administered 2016-08-11: 600 mg via ORAL
  Filled 2016-08-11: qty 1

## 2016-08-11 MED ORDER — IBUPROFEN 600 MG PO TABS: 600.0000 mg | ORAL_TABLET | Freq: Three times a day (TID) | ORAL | 0 refills | Status: DC | PRN

## 2016-08-11 MED ORDER — TRAMADOL HCL 50 MG PO TABS
50.0000 mg | ORAL_TABLET | Freq: Once | ORAL | Status: AC
  Administered 2016-08-11: 50 mg via ORAL
  Filled 2016-08-11: qty 1

## 2016-08-11 MED ORDER — TRAMADOL HCL 50 MG PO TABS: 50.0000 mg | ORAL_TABLET | Freq: Four times a day (QID) | ORAL | 0 refills | Status: DC | PRN

## 2016-08-11 NOTE — Discharge Instructions (Signed)
Ambulate with crutches for 2-3 days as needed. °
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
## 2016-08-25 ENCOUNTER — Other Ambulatory Visit: Payer: Self-pay | Admitting: Psychiatry

## 2016-09-16 ENCOUNTER — Encounter: Payer: Self-pay | Admitting: Urgent Care

## 2016-09-16 ENCOUNTER — Emergency Department: Payer: BLUE CROSS/BLUE SHIELD

## 2016-09-16 DIAGNOSIS — R0789 Other chest pain: Secondary | ICD-10-CM | POA: Diagnosis not present

## 2016-09-16 DIAGNOSIS — F17290 Nicotine dependence, other tobacco product, uncomplicated: Secondary | ICD-10-CM | POA: Diagnosis not present

## 2016-09-16 DIAGNOSIS — J45909 Unspecified asthma, uncomplicated: Secondary | ICD-10-CM | POA: Diagnosis not present

## 2016-09-16 DIAGNOSIS — Z79899 Other long term (current) drug therapy: Secondary | ICD-10-CM | POA: Insufficient documentation

## 2016-09-16 DIAGNOSIS — K2970 Gastritis, unspecified, without bleeding: Secondary | ICD-10-CM | POA: Insufficient documentation

## 2016-09-16 DIAGNOSIS — R1013 Epigastric pain: Secondary | ICD-10-CM | POA: Diagnosis present

## 2016-09-16 LAB — TROPONIN I: Troponin I: <0.03 ng/mL (ref <0.03)

## 2016-09-16 LAB — BASIC METABOLIC PANEL
ANION GAP: 11 (ref 5–15)
BUN: 11 mg/dL (ref 6–20)
CHLORIDE: 99 mmol/L — AB (ref 101–111)
CO2: 25 mmol/L (ref 22–32)
CREATININE: 0.59 mg/dL — AB (ref 0.61–1.24)
Calcium: 9.6 mg/dL (ref 8.9–10.3)
GFR calc non Af Amer: >60 mL/min (ref >60)
Glucose, Bld: 107 mg/dL — ABNORMAL HIGH (ref 65–99)
POTASSIUM: 3.6 mmol/L (ref 3.5–5.1)
SODIUM: 135 mmol/L (ref 135–145)

## 2016-09-16 LAB — CBC
HEMATOCRIT: 42.3 % (ref 40.0–52.0)
Hemoglobin: 14.8 g/dL (ref 13.0–18.0)
MCH: 31.1 pg (ref 26.0–34.0)
MCHC: 35 g/dL (ref 32.0–36.0)
MCV: 88.8 fL (ref 80.0–100.0)
PLATELETS: 223 10*3/uL (ref 150–440)
RBC: 4.77 MIL/uL (ref 4.40–5.90)
RDW: 13.6 % (ref 11.5–14.5)
WBC: 10.4 10*3/uL (ref 3.8–10.6)

## 2016-09-16 NOTE — ED Triage Notes (Signed)
Patient presents to the ED via EMS from home. Patient initially called EMS out in reference to some retrosternal CP that has been going on for a few hours. (+) nausea, SOB, and diaphoresis reported. Patient now reporting pain in epigastric ara and in RLQ of abdomen. Patient took Pepto Bismol PTA.

## 2016-09-17 ENCOUNTER — Emergency Department
Admission: EM | Admit: 2016-09-17 | Discharge: 2016-09-17 | Disposition: A | Discharge status: Home / Self Care | Payer: BLUE CROSS/BLUE SHIELD | Attending: Emergency Medicine | Admitting: Emergency Medicine

## 2016-09-17 DIAGNOSIS — K2970 Gastritis, unspecified, without bleeding: Secondary | ICD-10-CM

## 2016-09-17 LAB — LIPASE, BLOOD: LIPASE: 24 U/L (ref 11–51)

## 2016-09-17 LAB — URINALYSIS COMPLETE WITH MICROSCOPIC (ARMC ONLY)
BILIRUBIN URINE: NEGATIVE
Bacteria, UA: NONE SEEN
GLUCOSE, UA: NEGATIVE mg/dL
Hgb urine dipstick: NEGATIVE
KETONES UR: NEGATIVE mg/dL
Leukocytes, UA: NEGATIVE
Nitrite: NEGATIVE
Protein, ur: NEGATIVE mg/dL
RBC / HPF: NONE SEEN RBC/hpf (ref 0–5)
SPECIFIC GRAVITY, URINE: 1.003 — AB (ref 1.005–1.030)
Squamous Epithelial / LPF: NONE SEEN
WBC, UA: NONE SEEN WBC/hpf (ref 0–5)
pH: 7 (ref 5.0–8.0)

## 2016-09-17 MED ORDER — SUCRALFATE 1 G PO TABS: 1.0000 g | ORAL_TABLET | Freq: Four times a day (QID) | ORAL | 0 refills | Status: DC

## 2016-09-17 MED ORDER — RANITIDINE HCL 150 MG PO TABS: 150.0000 mg | ORAL_TABLET | Freq: Two times a day (BID) | ORAL | 1 refills | Status: DC

## 2016-09-17 MED ORDER — GI COCKTAIL ~~LOC~~
30.0000 mL | Freq: Once | ORAL | Status: AC
  Administered 2016-09-17: 30 mL via ORAL
  Filled 2016-09-17: qty 30

## 2016-09-17 NOTE — ED Provider Notes (Signed)
[facility] Emergency Department Provider Note    ____________________________________________   I have reviewed the triage vital signs and the nursing notes.   HISTORY  Chief Complaint Chest Pain; Abdominal Pain; Nausea; and Shortness of Breath   History limited by: Not Limited   HPI Kyle Wang is a 20 y.o. male who presents to the emergency department today because of concern for chest and abdominal pain. It started roughly 3 and a half hours prior to my evaluation. The patient states the symptoms started in the chest before moving to his epigastric and right side of his abdomen. The patient states that he had just finished drinking an energy drink before the symptoms started. The patient has had some associated nausea. No vomiting. No diarrhea. No fevers.   Past Medical History:  Diagnosis Date  . Asthma   . Bipolar 1 disorder (HCC)   . Suicide [facility])     Patient Active Problem List   Diagnosis Date Noted  . Self-inflicted laceration of wrist 07/12/2016  . Borderline personality disorder 07/12/2016  . GERD (gastroesophageal reflux disease) 06/02/2016  . Tobacco use disorder 06/02/2016  . Asthma 06/02/2016  . Suicidal ideation 06/01/2016  . Personality disorder 06/01/2016  . Bipolar disorder (HCC) 06/01/2016    Past Surgical History:  Procedure Laterality Date  . CHOLECYSTECTOMY      Prior to Admission medications   Medication Sig Start Date End Date Taking? Authorizing Provider  albuterol (PROVENTIL HFA;VENTOLIN HFA) 108 (90 Base) MCG/ACT inhaler Inhale 2 puffs into the lungs every 6 (six) hours as needed for wheezing or shortness of breath.    Historical Provider, MD  clonazePAM (KLONOPIN) 0.5 MG tablet Take 1 tablet (0.5 mg total) by mouth 3 (three) times daily. 07/12/16   Audery Amel, MD  clonazePAM (KLONOPIN) 0.5 MG tablet Take 0.5 mg by mouth 3 (three) times daily as needed for anxiety.    Historical Provider, MD   diphenhydrAMINE (BENADRYL) 50 MG capsule Take 1 capsule (50 mg total) by mouth at bedtime. 08/08/16   Audery Amel, MD  divalproex (DEPAKOTE ER) 500 MG 24 hr tablet Take 500 mg by mouth 2 (two) times daily.     Historical Provider, MD  divalproex (DEPAKOTE) 500 MG DR tablet Take 1 tablet (500 mg total) by mouth every 12 (twelve) hours. 08/08/16   Audery Amel, MD  escitalopram (LEXAPRO) 20 MG tablet Take 1 tablet (20 mg total) by mouth daily. 06/03/16   Jimmy Footman, MD  escitalopram (LEXAPRO) 20 MG tablet Take 1 tablet (20 mg total) by mouth daily. 08/09/16   Audery Amel, MD  ibuprofen (ADVIL,MOTRIN) 600 MG tablet Take 1 tablet (600 mg total) by mouth every 8 (eight) hours as needed. 08/11/16   Joni Reining, PA-C  ibuprofen (ADVIL,MOTRIN) 800 MG tablet Take 1 tablet (800 mg total) by mouth every 8 (eight) hours as needed. 05/30/16   Charmayne Sheer Beers, PA-C  lactobacillus acidophilus (BACID) TABS tablet Take 1 tablet by mouth daily.    Historical Provider, MD  Melatonin 3 MG TABS Take 3 mg by mouth at bedtime.    Historical Provider, MD  OLANZapine (ZYPREXA) 20 MG tablet Take 20 mg by mouth at bedtime.    Historical Provider, MD  OLANZapine (ZYPREXA) 20 MG tablet Take 1 tablet (20 mg total) by mouth at bedtime. 08/08/16   Audery Amel, MD  OLANZapine (ZYPREXA) 5 MG tablet Take 5 mg by mouth every morning.    Historical Provider,  MD  OLANZapine (ZYPREXA) 5 MG tablet Take 1 tablet (5 mg total) by mouth daily. 08/09/16   Audery AmelJohn T Clapacs, MD  omeprazole (PRILOSEC) 40 MG capsule Take 40 mg by mouth every evening.    Historical Provider, MD  traMADol (ULTRAM) 50 MG tablet Take 1 tablet (50 mg total) by mouth every 6 (six) hours as needed for moderate pain. 08/11/16   Joni Reiningonald K Smith, PA-C    Allergies Review of patient's allergies indicates no known allergies.  Family History  Problem Relation Age of Onset  . Adopted: Yes    Social History Social History  Substance Use Topics  .  Smoking status: Former Smoker    Packs/day: 2.00    Types: Cigarettes    Quit date: 08/18/2013  . Smokeless tobacco: Current User  . Alcohol use No    Review of Systems  Constitutional: Negative for fever. Cardiovascular: Positive for chest pain. Respiratory: Negative for shortness of breath. Gastrointestinal: Positive for abdominal pain. Genitourinary: Negative for dysuria. Musculoskeletal: Negative for back pain. Skin: Negative for rash. Neurological: Negative for headaches, focal weakness or numbness.  10-point ROS otherwise negative.  ____________________________________________   PHYSICAL EXAM:  VITAL SIGNS: ED Triage Vitals  Enc Vitals Group     BP 09/16/16 2328 136/77     Pulse Rate 09/16/16 2328 67     Resp 09/16/16 2328 18     Temp 09/16/16 2328 98.9 F (37.2 C)     Temp Source 09/16/16 2328 Oral     SpO2 09/16/16 2328 100 %     Weight 09/16/16 2328 220 lb (99.8 kg)     Height 09/16/16 2328 5\' 9"  (1.753 m)     Head Circumference --      Peak Flow --      Pain Score 09/16/16 2336 9   Constitutional: Alert and oriented. Well appearing and in no distress. Eyes: Conjunctivae are normal. Normal extraocular movements. ENT   Head: Normocephalic and atraumatic.   Nose: No congestion/rhinnorhea.   Mouth/Throat: Mucous membranes are moist.   Neck: No stridor. Hematological/Lymphatic/Immunilogical: No cervical lymphadenopathy. Cardiovascular: Normal rate, regular rhythm.  No murmurs, rubs, or gallops.  Respiratory: Normal respiratory effort without tachypnea nor retractions. Breath sounds are clear and equal bilaterally. No wheezes/rales/rhonchi. Gastrointestinal: Soft and slightly tender in the epigastric and right upper quadrants.  Genitourinary: Deferred Musculoskeletal: Normal range of motion in all extremities. No lower extremity edema. Neurologic:  Normal speech and language. No gross focal neurologic deficits are appreciated.  Skin:  Skin is  warm, dry and intact. No rash noted. Psychiatric: Mood and affect are normal. Speech and behavior are normal. Patient exhibits appropriate insight and judgment.  ____________________________________________    LABS (pertinent positives/negatives)  Labs Reviewed  BASIC METABOLIC PANEL - Abnormal; Notable for the following:       Result Value   Chloride 99 (*)    Glucose, Bld 107 (*)    Creatinine, Ser 0.59 (*)    All other components within normal limits  CBC  TROPONIN I  LIPASE, BLOOD     ____________________________________________   EKG  I, Phineas SemenGraydon Erroll Wilbourne, attending physician, personally viewed and interpreted this EKG  EKG Time: 2333 Rate: 71 Rhythm: normal sinus rhythm Axis: normal Intervals: qtc 410 QRS: narrow ST changes: no st elevation Impression: normal ekg   ____________________________________________    RADIOLOGY  CXR IMPRESSION:  No active cardiopulmonary disease.    ____________________________________________   PROCEDURES  Procedures  ____________________________________________   INITIAL IMPRESSION / ASSESSMENT  AND PLAN / ED COURSE  Pertinent labs & imaging results that were available during my care of the patient were reviewed by me and considered in my medical decision making (see chart for details).  Patient with chest pain that went to his abdomen. Work up here without concerning findings. Patient states that he felt better after GI cocktail. Will give antacid and sucralfate.  ____________________________________________   FINAL CLINICAL IMPRESSION(S) / ED DIAGNOSES  Final diagnoses:  Gastritis without bleeding, unspecified chronicity, unspecified gastritis type     Note: This dictation was prepared with Dragon dictation. Any transcriptional errors that result from this process are unintentional    Phineas SemenGraydon Krina Mraz, MD 09/17/16 [phone]

## 2016-09-17 NOTE — Discharge Instructions (Signed)
Please seek medical attention for any high fevers, chest pain, shortness of breath, change in behavior, persistent vomiting, bloody stool or any other new or concerning symptoms.  

## 2016-09-17 NOTE — ED Notes (Signed)
Pt sleeping soundly at this time.

## 2016-09-26 ENCOUNTER — Ambulatory Visit: Admission: RE | Admit: 2016-09-26 | Payer: BLUE CROSS/BLUE SHIELD | Source: Ambulatory Visit

## 2016-09-30 ENCOUNTER — Ambulatory Visit
Admission: RE | Admit: 2016-09-30 | Discharge: 2016-09-30 | Disposition: A | Discharge status: Home / Self Care | Payer: BLUE CROSS/BLUE SHIELD | Source: Ambulatory Visit | Attending: Internal Medicine | Admitting: Internal Medicine

## 2016-09-30 DIAGNOSIS — Z9049 Acquired absence of other specified parts of digestive tract: Secondary | ICD-10-CM | POA: Diagnosis not present

## 2016-09-30 DIAGNOSIS — R932 Abnormal findings on diagnostic imaging of liver and biliary tract: Secondary | ICD-10-CM | POA: Diagnosis not present

## 2016-09-30 DIAGNOSIS — R1031 Right lower quadrant pain: Secondary | ICD-10-CM | POA: Insufficient documentation

## 2016-09-30 DIAGNOSIS — R1084 Generalized abdominal pain: Secondary | ICD-10-CM

## 2016-09-30 DIAGNOSIS — R197 Diarrhea, unspecified: Secondary | ICD-10-CM | POA: Insufficient documentation

## 2016-10-17 ENCOUNTER — Encounter: Payer: Self-pay | Admitting: *Deleted

## 2016-10-17 DIAGNOSIS — K529 Noninfective gastroenteritis and colitis, unspecified: Secondary | ICD-10-CM | POA: Insufficient documentation

## 2016-10-17 DIAGNOSIS — J45909 Unspecified asthma, uncomplicated: Secondary | ICD-10-CM | POA: Diagnosis not present

## 2016-10-17 DIAGNOSIS — R1031 Right lower quadrant pain: Secondary | ICD-10-CM | POA: Diagnosis present

## 2016-10-17 DIAGNOSIS — Z87891 Personal history of nicotine dependence: Secondary | ICD-10-CM | POA: Diagnosis not present

## 2016-10-17 LAB — COMPREHENSIVE METABOLIC PANEL
ALT: 41 U/L (ref 17–63)
ANION GAP: 8 (ref 5–15)
AST: 30 U/L (ref 15–41)
Albumin: 4.2 g/dL (ref 3.5–5.0)
Alkaline Phosphatase: 39 U/L (ref 38–126)
BUN: 10 mg/dL (ref 6–20)
CALCIUM: 8.9 mg/dL (ref 8.9–10.3)
CHLORIDE: 108 mmol/L (ref 101–111)
CO2: 24 mmol/L (ref 22–32)
Creatinine, Ser: 0.58 mg/dL — ABNORMAL LOW (ref 0.61–1.24)
Glucose, Bld: 84 mg/dL (ref 65–99)
Potassium: 3 mmol/L — ABNORMAL LOW (ref 3.5–5.1)
SODIUM: 140 mmol/L (ref 135–145)
Total Bilirubin: 0.5 mg/dL (ref 0.3–1.2)
Total Protein: 7.1 g/dL (ref 6.5–8.1)

## 2016-10-17 LAB — URINALYSIS COMPLETE WITH MICROSCOPIC (ARMC ONLY)
Bacteria, UA: NONE SEEN
Bilirubin Urine: NEGATIVE
Glucose, UA: NEGATIVE mg/dL
HGB URINE DIPSTICK: NEGATIVE
Ketones, ur: NEGATIVE mg/dL
LEUKOCYTES UA: NEGATIVE
Nitrite: NEGATIVE
PH: 9 — AB (ref 5.0–8.0)
PROTEIN: NEGATIVE mg/dL
SPECIFIC GRAVITY, URINE: 1.003 — AB (ref 1.005–1.030)
Squamous Epithelial / LPF: NONE SEEN
WBC UA: NONE SEEN WBC/hpf (ref 0–5)

## 2016-10-17 LAB — CBC
HCT: 44.1 % (ref 40.0–52.0)
HEMOGLOBIN: 15.3 g/dL (ref 13.0–18.0)
MCH: 30.8 pg (ref 26.0–34.0)
MCHC: 34.7 g/dL (ref 32.0–36.0)
MCV: 88.5 fL (ref 80.0–100.0)
Platelets: 210 10*3/uL (ref 150–440)
RBC: 4.99 MIL/uL (ref 4.40–5.90)
RDW: 13.6 % (ref 11.5–14.5)
WBC: 10.2 10*3/uL (ref 3.8–10.6)

## 2016-10-17 LAB — LIPASE, BLOOD: LIPASE: 26 U/L (ref 11–51)

## 2016-10-17 NOTE — ED Notes (Signed)
Pt placed in subwait.  Iv in place.  

## 2016-10-17 NOTE — ED Triage Notes (Signed)
Pt brought in via ems from home.  Pt states right lower abd pain for 1 day.  Vomit x 2   Diarrhea x 4.  No back pain.  Pt alert.  Iv in place.

## 2016-10-18 ENCOUNTER — Emergency Department
Admission: EM | Admit: 2016-10-18 | Discharge: 2016-10-18 | Disposition: A | Discharge status: Home / Self Care | Payer: BLUE CROSS/BLUE SHIELD | Attending: Emergency Medicine | Admitting: Emergency Medicine

## 2016-10-18 ENCOUNTER — Emergency Department: Payer: BLUE CROSS/BLUE SHIELD

## 2016-10-18 ENCOUNTER — Encounter: Payer: Self-pay | Admitting: Radiology

## 2016-10-18 DIAGNOSIS — R112 Nausea with vomiting, unspecified: Secondary | ICD-10-CM

## 2016-10-18 DIAGNOSIS — R197 Diarrhea, unspecified: Secondary | ICD-10-CM

## 2016-10-18 DIAGNOSIS — K529 Noninfective gastroenteritis and colitis, unspecified: Secondary | ICD-10-CM

## 2016-10-18 MED ORDER — POTASSIUM CHLORIDE CRYS ER 20 MEQ PO TBCR
40.0000 meq | EXTENDED_RELEASE_TABLET | Freq: Once | ORAL | Status: AC
  Administered 2016-10-18: 40 meq via ORAL
  Filled 2016-10-18: qty 2

## 2016-10-18 MED ORDER — IOPAMIDOL (ISOVUE-300) INJECTION 61%
100.0000 mL | Freq: Once | INTRAVENOUS | Status: AC | PRN
  Administered 2016-10-18: 100 mL via INTRAVENOUS

## 2016-10-18 MED ORDER — ONDANSETRON HCL 4 MG/2ML IJ SOLN
4.0000 mg | Freq: Once | INTRAMUSCULAR | Status: AC
  Administered 2016-10-18: 4 mg via INTRAVENOUS
  Filled 2016-10-18: qty 2

## 2016-10-18 MED ORDER — ONDANSETRON 4 MG PO TBDP: 4.0000 mg | ORAL_TABLET | Freq: Three times a day (TID) | ORAL | 0 refills | Status: DC | PRN

## 2016-10-18 MED ORDER — SODIUM CHLORIDE 0.9 % IV BOLUS (SEPSIS)
1000.0000 mL | Freq: Once | INTRAVENOUS | Status: AC
  Administered 2016-10-18: 1000 mL via INTRAVENOUS

## 2016-10-18 MED ORDER — MORPHINE SULFATE (PF) 4 MG/ML IV SOLN
4.0000 mg | Freq: Once | INTRAVENOUS | Status: AC
  Administered 2016-10-18: 4 mg via INTRAVENOUS
  Filled 2016-10-18: qty 1

## 2016-10-18 NOTE — ED Notes (Signed)
EDP at bedside  

## 2016-10-18 NOTE — ED Provider Notes (Signed)
[facility] Emergency Department Provider Note  ____________________________________________  Time seen: Approximately 1:10 AM  I have reviewed the triage vital signs and the nursing notes.   HISTORY  Chief Complaint Abdominal Pain and Emesis   HPI Kyle Wang is a 20 y.o. male with a history of the eye polar disorder who presents for evaluation of right lower quadrant abdominal pain. Patient reports that at 5 PM he went to urinate when he developed sudden onset of sharp severe periumbilical abdominal pain which then migrated to the right lower quadrant. The pain has been constant, sharp, severe, located in the right lower quadrant, nonradiating. He also endorses pain with urination but no hematuria, no prior history of kidney stones. Patient's only prior abdominal surgery was cholecystectomy many years ago. Patient reports nausea and 3 episodes of nonbloody nonbilious emesis and 2 of diarrhea associated with the pain. No fever but has had chills.  Past Medical History:  Diagnosis Date  . Asthma   . Bipolar 1 disorder (HCC)   . Suicide [facility](HCC)     Patient Active Problem List   Diagnosis Date Noted  . Self-inflicted laceration of wrist 07/12/2016  . Borderline personality disorder 07/12/2016  . GERD (gastroesophageal reflux disease) 06/02/2016  . Tobacco use disorder 06/02/2016  . Asthma 06/02/2016  . Suicidal ideation 06/01/2016  . Personality disorder 06/01/2016  . Bipolar disorder (HCC) 06/01/2016    Past Surgical History:  Procedure Laterality Date  . CHOLECYSTECTOMY      Prior to Admission medications   Medication Sig Start Date End Date Taking? Authorizing Provider  clonazePAM (KLONOPIN) 0.5 MG tablet Take 1 tablet (0.5 mg total) by mouth 3 (three) times daily. 07/12/16   Audery AmelJohn T Clapacs, MD  diphenhydrAMINE (BENADRYL) 50 MG capsule Take 1 capsule (50 mg total) by mouth at bedtime. 08/08/16   Audery AmelJohn T Clapacs, MD  divalproex (DEPAKOTE) 500  MG DR tablet Take 1 tablet (500 mg total) by mouth every 12 (twelve) hours. 08/08/16   Audery AmelJohn T Clapacs, MD  escitalopram (LEXAPRO) 20 MG tablet Take 1 tablet (20 mg total) by mouth daily. 08/09/16   Audery AmelJohn T Clapacs, MD  ibuprofen (ADVIL,MOTRIN) 600 MG tablet Take 1 tablet (600 mg total) by mouth every 8 (eight) hours as needed. 08/11/16   Joni Reiningonald K Smith, PA-C  ibuprofen (ADVIL,MOTRIN) 800 MG tablet Take 1 tablet (800 mg total) by mouth every 8 (eight) hours as needed. 05/30/16   Charmayne Sheerharles M Beers, PA-C  OLANZapine (ZYPREXA) 20 MG tablet Take 1 tablet (20 mg total) by mouth at bedtime. 08/08/16   Audery AmelJohn T Clapacs, MD  OLANZapine (ZYPREXA) 5 MG tablet Take 1 tablet (5 mg total) by mouth daily. 08/09/16   Audery AmelJohn T Clapacs, MD  ondansetron (ZOFRAN ODT) 4 MG disintegrating tablet Take 1 tablet (4 mg total) by mouth every 8 (eight) hours as needed for nausea or vomiting. 10/18/16   Nita Sicklearolina Iriel Nason, MD  ranitidine (ZANTAC) 150 MG tablet Take 1 tablet (150 mg total) by mouth 2 (two) times daily. 09/17/16 09/17/17  Phineas SemenGraydon Goodman, MD  sucralfate (CARAFATE) 1 g tablet Take 1 tablet (1 g total) by mouth 4 (four) times daily. 09/17/16   Phineas SemenGraydon Goodman, MD  traMADol (ULTRAM) 50 MG tablet Take 1 tablet (50 mg total) by mouth every 6 (six) hours as needed for moderate pain. 08/11/16   Joni Reiningonald K Smith, PA-C    Allergies Patient has no known allergies.  Family History  Problem Relation Age of Onset  . Adopted:  Yes    Social History Social History  Substance Use Topics  . Smoking status: Former Smoker    Packs/day: 2.00    Types: Cigarettes    Quit date: 08/18/2013  . Smokeless tobacco: Current User  . Alcohol use No    Review of Systems  Constitutional: Negative for fever. + chills Eyes: Negative for visual changes. ENT: Negative for sore throat. Neck: No neck pain  Cardiovascular: Negative for chest pain. Respiratory: Negative for shortness of breath. Gastrointestinal: + RLQ abdominal pain, vomiting and  diarrhea. Genitourinary: Negative for dysuria. Musculoskeletal: Negative for back pain. Skin: Negative for rash. Neurological: Negative for headaches, weakness or numbness. Psych: No SI or HI  ____________________________________________   PHYSICAL EXAM:  VITAL SIGNS: ED Triage Vitals  Enc Vitals Group     BP 10/17/16 2125 (!) 153/83     Pulse Rate 10/17/16 2125 98     Resp 10/17/16 2125 20     Temp 10/17/16 2125 98.7 F (37.1 C)     Temp Source 10/17/16 2125 Oral     SpO2 10/17/16 2125 98 %     Weight 10/17/16 2126 220 lb (99.8 kg)     Height 10/17/16 2126 5\' 9"  (1.753 m)     Head Circumference --      Peak Flow --      Pain Score 10/17/16 2137 10     Pain Loc --      Pain Edu? --      Excl. in GC? --     Constitutional: Alert and oriented. Well appearing and in no apparent distress. HEENT:      Head: Normocephalic and atraumatic.         Eyes: Conjunctivae are normal. Sclera is non-icteric. EOMI. PERRL      Mouth/Throat: Mucous membranes are moist.       Neck: Supple with no signs of meningismus. Cardiovascular: Regular rate and rhythm. No murmurs, gallops, or rubs. 2+ symmetrical distal pulses are present in all extremities. No JVD. Respiratory: Normal respiratory effort. Lungs are clear to auscultation bilaterally. No wheezes, crackles, or rhonchi.  Gastrointestinal: Soft, ttp over the RLQ with localized guarding, non distended with positive bowel sounds. No rebound. Genitourinary: No CVA tenderness. Bilateral testicles are descended with no tenderness to palpation, bilateral positive cremasteric reflexes are present, no swelling or erythema of the scrotum. No evidence of inguinal hernia. Musculoskeletal: Nontender with normal range of motion in all extremities. No edema, cyanosis, or erythema of extremities. Neurologic: Normal speech and language. Face is symmetric. Moving all extremities. No gross focal neurologic deficits are appreciated. Skin: Skin is warm, dry and  intact. No rash noted. Psychiatric: Mood and affect are normal. Speech and behavior are normal.  ____________________________________________   LABS (all labs ordered are listed, but only abnormal results are displayed)  Labs Reviewed  COMPREHENSIVE METABOLIC PANEL - Abnormal; Notable for the following:       Result Value   Potassium 3.0 (*)    Creatinine, Ser 0.58 (*)    All other components within normal limits  URINALYSIS COMPLETEWITH MICROSCOPIC ([facility] ONLY) - Abnormal; Notable for the following:    Color, Urine STRAW (*)    APPearance CLEAR (*)    Specific Gravity, Urine 1.003 (*)    pH 9.0 (*)    All other components within normal limits  CBC  LIPASE, BLOOD   ____________________________________________  EKG  none ____________________________________________  RADIOLOGY  CT a/p : no acute findings ____________________________________________   PROCEDURES  Procedure(s) performed: None Procedures Critical Care performed:  None ____________________________________________   INITIAL IMPRESSION / ASSESSMENT AND PLAN / ED COURSE  20 y.o. male with a history of the eye polar disorder who presents for evaluation of sudden periumbillical abdominal pain associated with dysuria which migrated to the RLQ. Patient has significant tenderness to palpation on the lower right lower quadrant with localized guarding but no rebound. Vital signs are within normal limits. UA with no evidence of UTI or hematuria, CBC and CMP within normal limits. Differential diagnoses including kidney stone versus appendicitis. CT scan pending.  Clinical Course as of Oct 18 236  Tue Oct 18, 2016  0236 CT, labs, urinalysis and no acute findings. Patient be discharged home.  [CV]    Clinical Course User Index [CV] Nita Sicklearolina Kennth Vanbenschoten, MD    Pertinent labs & imaging results that were available during my care of the patient were reviewed by me and considered in my medical decision making (see chart  for details).    ____________________________________________   FINAL CLINICAL IMPRESSION(S) / ED DIAGNOSES  Final diagnoses:  Nausea vomiting and diarrhea  Gastroenteritis      NEW MEDICATIONS STARTED DURING THIS VISIT:  New Prescriptions   ONDANSETRON (ZOFRAN ODT) 4 MG DISINTEGRATING TABLET    Take 1 tablet (4 mg total) by mouth every 8 (eight) hours as needed for nausea or vomiting.     Note:  This document was prepared using Dragon voice recognition software and may include unintentional dictation errors.    Nita Sicklearolina Dasan Hardman, MD 10/18/16 [phone]

## 2016-11-17 ENCOUNTER — Emergency Department
Admission: EM | Admit: 2016-11-17 | Discharge: 2016-11-17 | Disposition: A | Discharge status: Home / Self Care | Payer: BLUE CROSS/BLUE SHIELD | Attending: Emergency Medicine | Admitting: Emergency Medicine

## 2016-11-17 DIAGNOSIS — T65291A Toxic effect of other tobacco and nicotine, accidental (unintentional), initial encounter: Secondary | ICD-10-CM | POA: Diagnosis not present

## 2016-11-17 DIAGNOSIS — J45909 Unspecified asthma, uncomplicated: Secondary | ICD-10-CM | POA: Diagnosis not present

## 2016-11-17 DIAGNOSIS — R112 Nausea with vomiting, unspecified: Secondary | ICD-10-CM | POA: Diagnosis present

## 2016-11-17 DIAGNOSIS — F17290 Nicotine dependence, other tobacco product, uncomplicated: Secondary | ICD-10-CM | POA: Diagnosis not present

## 2016-11-17 DIAGNOSIS — Z79899 Other long term (current) drug therapy: Secondary | ICD-10-CM | POA: Insufficient documentation

## 2016-11-17 DIAGNOSIS — T50901A Poisoning by unspecified drugs, medicaments and biological substances, accidental (unintentional), initial encounter: Secondary | ICD-10-CM

## 2016-11-17 LAB — COMPREHENSIVE METABOLIC PANEL
ALK PHOS: 39 U/L (ref 38–126)
ALT: 77 U/L — ABNORMAL HIGH (ref 17–63)
ANION GAP: 9 (ref 5–15)
AST: 42 U/L — ABNORMAL HIGH (ref 15–41)
Albumin: 4.7 g/dL (ref 3.5–5.0)
BILIRUBIN TOTAL: 0.6 mg/dL (ref 0.3–1.2)
BUN: 10 mg/dL (ref 6–20)
CALCIUM: 10 mg/dL (ref 8.9–10.3)
CO2: 29 mmol/L (ref 22–32)
Chloride: 98 mmol/L — ABNORMAL LOW (ref 101–111)
Creatinine, Ser: 0.6 mg/dL — ABNORMAL LOW (ref 0.61–1.24)
Glucose, Bld: 138 mg/dL — ABNORMAL HIGH (ref 65–99)
Potassium: 3.9 mmol/L (ref 3.5–5.1)
Sodium: 136 mmol/L (ref 135–145)
TOTAL PROTEIN: 7.9 g/dL (ref 6.5–8.1)

## 2016-11-17 LAB — CBC
HCT: 41 % (ref 40.0–52.0)
HEMOGLOBIN: 14.4 g/dL (ref 13.0–18.0)
MCH: 31.5 pg (ref 26.0–34.0)
MCHC: 35.1 g/dL (ref 32.0–36.0)
MCV: 89.7 fL (ref 80.0–100.0)
Platelets: 217 10*3/uL (ref 150–440)
RBC: 4.57 MIL/uL (ref 4.40–5.90)
RDW: 13.3 % (ref 11.5–14.5)
WBC: 8.2 10*3/uL (ref 3.8–10.6)

## 2016-11-17 LAB — URINALYSIS, COMPLETE (UACMP) WITH MICROSCOPIC
BILIRUBIN URINE: NEGATIVE
Bacteria, UA: NONE SEEN
GLUCOSE, UA: 50 mg/dL — AB
HGB URINE DIPSTICK: NEGATIVE
Ketones, ur: NEGATIVE mg/dL
Leukocytes, UA: NEGATIVE
NITRITE: NEGATIVE
Protein, ur: NEGATIVE mg/dL
RBC / HPF: NONE SEEN RBC/hpf (ref 0–5)
SPECIFIC GRAVITY, URINE: 1.005 (ref 1.005–1.030)
Squamous Epithelial / LPF: NONE SEEN
pH: 6 (ref 5.0–8.0)

## 2016-11-17 LAB — LIPASE, BLOOD: Lipase: 22 U/L (ref 11–51)

## 2016-11-17 MED ORDER — ONDANSETRON 4 MG PO TBDP
ORAL_TABLET | ORAL | Status: AC
  Filled 2016-11-17: qty 1

## 2016-11-17 MED ORDER — ONDANSETRON 4 MG PO TBDP
4.0000 mg | ORAL_TABLET | Freq: Once | ORAL | Status: AC
Start: 2016-11-17 — End: 2016-11-17
  Administered 2016-11-17: 4 mg via ORAL

## 2016-11-17 NOTE — ED Triage Notes (Signed)
Pt c/o generalized abd pain with N/V/D since this morning..Marland Kitchen

## 2016-11-17 NOTE — ED Notes (Signed)
Pt given sprite for PO challenge. Pt alert and oriented X4, active, cooperative, pt in NAD. RR even and unlabored, color WNL.
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
## 2016-12-02 ENCOUNTER — Emergency Department
Admission: EM | Admit: 2016-12-02 | Discharge: 2016-12-02 | Disposition: A | Discharge status: Home / Self Care | Payer: BLUE CROSS/BLUE SHIELD | Attending: Emergency Medicine | Admitting: Emergency Medicine

## 2016-12-02 ENCOUNTER — Emergency Department: Payer: BLUE CROSS/BLUE SHIELD

## 2016-12-02 ENCOUNTER — Encounter: Payer: Self-pay | Admitting: Emergency Medicine

## 2016-12-02 DIAGNOSIS — Z20818 Contact with and (suspected) exposure to other bacterial communicable diseases: Secondary | ICD-10-CM | POA: Diagnosis not present

## 2016-12-02 DIAGNOSIS — Z2089 Contact with and (suspected) exposure to other communicable diseases: Secondary | ICD-10-CM

## 2016-12-02 DIAGNOSIS — F17200 Nicotine dependence, unspecified, uncomplicated: Secondary | ICD-10-CM | POA: Insufficient documentation

## 2016-12-02 DIAGNOSIS — J209 Acute bronchitis, unspecified: Secondary | ICD-10-CM | POA: Diagnosis not present

## 2016-12-02 DIAGNOSIS — Z79899 Other long term (current) drug therapy: Secondary | ICD-10-CM | POA: Diagnosis not present

## 2016-12-02 DIAGNOSIS — R509 Fever, unspecified: Secondary | ICD-10-CM | POA: Diagnosis present

## 2016-12-02 DIAGNOSIS — J45909 Unspecified asthma, uncomplicated: Secondary | ICD-10-CM | POA: Diagnosis not present

## 2016-12-02 DIAGNOSIS — R69 Illness, unspecified: Secondary | ICD-10-CM

## 2016-12-02 DIAGNOSIS — J111 Influenza due to unidentified influenza virus with other respiratory manifestations: Secondary | ICD-10-CM

## 2016-12-02 LAB — INFLUENZA PANEL BY PCR (TYPE A & B)
INFLAPCR: NEGATIVE
Influenza B By PCR: NEGATIVE

## 2016-12-02 MED ORDER — PREDNISONE 5 MG PO TABS: 30.0000 mg | ORAL_TABLET | Freq: Every day | ORAL | 0 refills | Status: DC

## 2016-12-02 MED ORDER — FLUTICASONE PROPIONATE 50 MCG/ACT NA SUSP: 2.0000 | Freq: Every day | NASAL | 0 refills | Status: DC

## 2016-12-02 MED ORDER — IPRATROPIUM-ALBUTEROL 0.5-2.5 (3) MG/3ML IN SOLN
3.0000 mL | Freq: Once | RESPIRATORY_TRACT | Status: AC
  Administered 2016-12-02: 3 mL via RESPIRATORY_TRACT
  Filled 2016-12-02: qty 3

## 2016-12-02 MED ORDER — AZITHROMYCIN 250 MG PO TABS: ORAL_TABLET | ORAL | 0 refills | Status: DC

## 2016-12-02 MED ORDER — PROMETHAZINE-DM 6.25-15 MG/5ML PO SYRP: 5.0000 mL | ORAL_SOLUTION | Freq: Four times a day (QID) | ORAL | 0 refills | Status: DC | PRN

## 2016-12-02 NOTE — ED Triage Notes (Signed)
Pt to ed with c/o fever, body aches x 3 days.

## 2016-12-02 NOTE — ED Provider Notes (Signed)
[facility] Emergency Department Provider Note  ____________________________________________  Time seen: Approximately 12:02 PM  I have reviewed the triage vital signs and the nursing notes.   HISTORY  Chief Complaint Generalized Body Aches and Fever    HPI Kyle Wang is a 21 y.o. male , NAD, presents to the emergency history of fever, chills, cough or chest congestion. Patient states he had sudden onset of fever, chills and body aches approximately 3 days ago. 2 days ago he began with cough, chest congestion, shortness of breath and wheezing. States he was exposed to a housemate who recently was diagnosed and treated for pneumonia. Patient states he has asthma and has had to use his rescue inhaler more often over the last couple of days due to the wheezing. Denies chest pain, abdominal pain, nausea, vomiting or diarrhea. Has been taking over-the-counter ibuprofen but no other cough or cold remedies.   Past Medical History:  Diagnosis Date  . Asthma   . Bipolar 1 disorder (HCC)   . Suicide [facility](HCC)     Patient Active Problem List   Diagnosis Date Noted  . Self-inflicted laceration of wrist 07/12/2016  . Borderline personality disorder 07/12/2016  . GERD (gastroesophageal reflux disease) 06/02/2016  . Tobacco use disorder 06/02/2016  . Asthma 06/02/2016  . Suicidal ideation 06/01/2016  . Personality disorder 06/01/2016  . Bipolar disorder (HCC) 06/01/2016    Past Surgical History:  Procedure Laterality Date  . CHOLECYSTECTOMY      Prior to Admission medications   Medication Sig Start Date End Date Taking? Authorizing Provider  azithromycin (ZITHROMAX Z-PAK) 250 MG tablet Take 2 tablets (500 mg) on  Day 1,  followed by 1 tablet (250 mg) once daily on Days 2 through 5. 12/02/16   Arlie Riker L Brodrick Curran, PA-C  clonazePAM (KLONOPIN) 0.5 MG tablet Take 1 tablet (0.5 mg total) by mouth 3 (three) times daily. 07/12/16   Audery AmelJohn T Clapacs, MD  diphenhydrAMINE  (BENADRYL) 50 MG capsule Take 1 capsule (50 mg total) by mouth at bedtime. 08/08/16   Audery AmelJohn T Clapacs, MD  divalproex (DEPAKOTE) 500 MG DR tablet Take 1 tablet (500 mg total) by mouth every 12 (twelve) hours. 08/08/16   Audery AmelJohn T Clapacs, MD  escitalopram (LEXAPRO) 20 MG tablet Take 1 tablet (20 mg total) by mouth daily. 08/09/16   Audery AmelJohn T Clapacs, MD  fluticasone (FLONASE) 50 MCG/ACT nasal spray Place 2 sprays into both nostrils daily. 12/02/16   Samiha Denapoli L Gao Mitnick, PA-C  ibuprofen (ADVIL,MOTRIN) 600 MG tablet Take 1 tablet (600 mg total) by mouth every 8 (eight) hours as needed. 08/11/16   Joni Reiningonald K Smith, PA-C  ibuprofen (ADVIL,MOTRIN) 800 MG tablet Take 1 tablet (800 mg total) by mouth every 8 (eight) hours as needed. 05/30/16   Charmayne Sheerharles M Beers, PA-C  OLANZapine (ZYPREXA) 20 MG tablet Take 1 tablet (20 mg total) by mouth at bedtime. 08/08/16   Audery AmelJohn T Clapacs, MD  OLANZapine (ZYPREXA) 5 MG tablet Take 1 tablet (5 mg total) by mouth daily. 08/09/16   Audery AmelJohn T Clapacs, MD  ondansetron (ZOFRAN ODT) 4 MG disintegrating tablet Take 1 tablet (4 mg total) by mouth every 8 (eight) hours as needed for nausea or vomiting. 10/18/16   Nita Sicklearolina Veronese, MD  predniSONE (DELTASONE) 5 MG tablet Take 6 tablets (30 mg total) by mouth daily with breakfast. May take for up to 5 days.  Do not take any NSAIDs with this medication. Take with food. 12/02/16   Daphene Chisholm L Jaythen Hamme, PA-C  promethazine-dextromethorphan (PROMETHAZINE-DM) 6.25-15 MG/5ML syrup Take 5 mLs by mouth 4 (four) times daily as needed for cough. 12/02/16   Fama Muenchow L Charnese Federici, PA-C  ranitidine (ZANTAC) 150 MG tablet Take 1 tablet (150 mg total) by mouth 2 (two) times daily. 09/17/16 09/17/17  Phineas Semen, MD  sucralfate (CARAFATE) 1 g tablet Take 1 tablet (1 g total) by mouth 4 (four) times daily. 09/17/16   Phineas Semen, MD  traMADol (ULTRAM) 50 MG tablet Take 1 tablet (50 mg total) by mouth every 6 (six) hours as needed for moderate pain. 08/11/16   Joni Reining, PA-C     Allergies Patient has no known allergies.  Family History  Problem Relation Age of Onset  . Adopted: Yes    Social History Social History  Substance Use Topics  . Smoking status: Former Smoker    Packs/day: 2.00    Types: Cigarettes    Quit date: 08/18/2013  . Smokeless tobacco: Current User  . Alcohol use No     Review of Systems  Constitutional: Positive fever, chills and fatigue. ENT: Positive nasal congestion, runny nose, sinus pressure. No sore throat, ear pain. Cardiovascular: No chest pain, palpitations. Respiratory: Positive cough, chest congestion, shortness breath, wheezing. Gastrointestinal: No abdominal pain.  No nausea, vomiting.  No diarrhea.   Musculoskeletal: Positive for general myalgias. No back pain.  Skin: Negative for rash, redness, swelling or edema. Neurological: Negative for headaches, focal weakness or numbness. No lightheadedness or dizziness. 10-point ROS otherwise negative.  ____________________________________________   PHYSICAL EXAM:  VITAL SIGNS: ED Triage Vitals  Enc Vitals Group     BP 12/02/16 1035 (!) 142/86     Pulse Rate 12/02/16 1035 74     Resp 12/02/16 1035 20     Temp 12/02/16 1035 98.4 F (36.9 C)     Temp Source 12/02/16 1035 Oral     SpO2 12/02/16 1035 98 %     Weight 12/02/16 1036 220 lb (99.8 kg)     Height --      Head Circumference --      Peak Flow --      Pain Score 12/02/16 1036 6     Pain Loc --      Pain Edu? --      Excl. in GC? --      Constitutional: Alert and oriented. Well appearing and in no acute distress. Eyes: Conjunctivae are normal without icterus, injection or discharge. Head: Atraumatic. ENT:      Ears: TMs visualized bilaterally with mild injection and serous effusion but no bulging or perforation.      Nose: Congestion with moderate rhinorrhea. Bilateral turbinates are injected and edematous      Mouth/Throat: Mucous membranes are moist. Pharynx without erythema, swelling, exudate.  Clear postnasal drip. Uvula is midline. Airways pain. Neck: Supple with full range of motion. Hematological/Lymphatic/Immunilogical: No cervical lymphadenopathy. Cardiovascular: Normal rate, regular rhythm. Normal S1 and S2.  Good peripheral circulation. Respiratory: Normal respiratory effort without tachypnea or retractions. Lungs with diffuse mild wheezing throughout. No rhonchi or rales. Breath sounds are noted throughout all lung fields. Musculoskeletal: No lower extremity tenderness nor edema.  No joint effusions. Neurologic:  Normal speech and language. No gross focal neurologic deficits are appreciated.  Skin:  Skin is warm, dry and intact. No rash noted. Psychiatric: Mood and affect are normal. Speech and behavior are normal. Patient exhibits appropriate insight and judgement.   ____________________________________________   LABS (all labs ordered are listed, but only abnormal results  are displayed)  Labs Reviewed  INFLUENZA PANEL BY PCR (TYPE A & B, H1N1)   ____________________________________________  EKG  None ____________________________________________  RADIOLOGY I, Ernestene Kiel Saryn Cherry, personally viewed and evaluated these images (plain radiographs) as part of my medical decision making, as well as reviewing the written report by the radiologist.  Dg Chest 2 View  Result Date: 12/02/2016 CLINICAL DATA:  Fever, shortness of breath, cough, chest pain. EXAM: CHEST  2 VIEW COMPARISON:  Radiographs of September 16, 2016. FINDINGS: The heart size and mediastinal contours are within normal limits. Both lungs are clear. No pneumothorax or pleural effusion is noted. The visualized skeletal structures are unremarkable. IMPRESSION: No active cardiopulmonary disease. Electronically Signed   By: Lupita Raider, M.D.   On: 12/02/2016 12:56    ____________________________________________    PROCEDURES  Procedure(s) performed: None   Procedures   Medications   ipratropium-albuterol (DUONEB) 0.5-2.5 (3) MG/3ML nebulizer solution 3 mL (3 mLs Nebulization Given 12/02/16 1258)     ____________________________________________   INITIAL IMPRESSION / ASSESSMENT AND PLAN / ED COURSE  Pertinent labs & imaging results that were available during my care of the patient were reviewed by me and considered in my medical decision making (see chart for details).  Clinical Course as of Dec 02 1840  Fri Dec 02, 2016  1345 Waiting on influenza PCR to result. Patient is doing well and reports decreased cough and improved breathing ability after DuoNeb was administered.  [JH]    Clinical Course User Index [JH] Viha Kriegel L Tommy Minichiello, PA-C    Patient's diagnosis is consistent with Influenza-like illness, exposure to pneumonia and acute bronchitis. Patient will be discharged home with prescriptions for azithromycin, Flonase, prednisone and promethazine DM to take as directed. Patient states he does not need in the refills on albuterol at this time. Patient is to follow up with his primary care provider in 48 hours if symptoms persist past this treatment course. Patient is given ED precautions to return to the ED for any worsening or new symptoms.    ____________________________________________  FINAL CLINICAL IMPRESSION(S) / ED DIAGNOSES  Final diagnoses:  Influenza-like illness  Exposure to pneumonia  Acute bronchitis, unspecified organism      NEW MEDICATIONS STARTED DURING THIS VISIT:  Discharge Medication List as of 12/02/2016  1:59 PM    START taking these medications   Details  azithromycin (ZITHROMAX Z-PAK) 250 MG tablet Take 2 tablets (500 mg) on  Day 1,  followed by 1 tablet (250 mg) once daily on Days 2 through 5., Print    fluticasone (FLONASE) 50 MCG/ACT nasal spray Place 2 sprays into both nostrils daily., Starting Fri 12/02/2016, Print    predniSONE (DELTASONE) 5 MG tablet Take 6 tablets (30 mg total) by mouth daily with breakfast. May take for up  to 5 days.  Do not take any NSAIDs with this medication. Take with food., Starting Fri 12/02/2016, Print    promethazine-dextromethorphan (PROMETHAZINE-DM) 6.25-15 MG/5ML syrup Take 5 mLs by mouth 4 (four) times daily as needed for cough., Starting Fri 12/02/2016, Print             Ernestene Kiel [place], PA-C 12/02/16 1843    Emily Filbert, MD 12/03/16 1500

## 2017-01-06 ENCOUNTER — Emergency Department
Admission: EM | Admit: 2017-01-06 | Discharge: 2017-01-14 | Disposition: A | Discharge status: Home / Self Care | Payer: BLUE CROSS/BLUE SHIELD | Attending: Emergency Medicine | Admitting: Emergency Medicine

## 2017-01-06 ENCOUNTER — Encounter: Payer: Self-pay | Admitting: Emergency Medicine

## 2017-01-06 DIAGNOSIS — R1031 Right lower quadrant pain: Secondary | ICD-10-CM | POA: Insufficient documentation

## 2017-01-06 DIAGNOSIS — F319 Bipolar disorder, unspecified: Secondary | ICD-10-CM | POA: Diagnosis present

## 2017-01-06 DIAGNOSIS — R45851 Suicidal ideations: Secondary | ICD-10-CM | POA: Diagnosis present

## 2017-01-06 DIAGNOSIS — Z79899 Other long term (current) drug therapy: Secondary | ICD-10-CM | POA: Insufficient documentation

## 2017-01-06 DIAGNOSIS — J45909 Unspecified asthma, uncomplicated: Secondary | ICD-10-CM | POA: Diagnosis not present

## 2017-01-06 DIAGNOSIS — F17200 Nicotine dependence, unspecified, uncomplicated: Secondary | ICD-10-CM | POA: Diagnosis not present

## 2017-01-06 DIAGNOSIS — F32A Depression, unspecified: Secondary | ICD-10-CM

## 2017-01-06 DIAGNOSIS — F603 Borderline personality disorder: Secondary | ICD-10-CM | POA: Diagnosis present

## 2017-01-06 DIAGNOSIS — F329 Major depressive disorder, single episode, unspecified: Secondary | ICD-10-CM | POA: Insufficient documentation

## 2017-01-06 LAB — CBC
HCT: 45.1 % (ref 40.0–52.0)
HEMOGLOBIN: 15.2 g/dL (ref 13.0–18.0)
MCH: 30.2 pg (ref 26.0–34.0)
MCHC: 33.7 g/dL (ref 32.0–36.0)
MCV: 89.6 fL (ref 80.0–100.0)
PLATELETS: 253 10*3/uL (ref 150–440)
RBC: 5.03 MIL/uL (ref 4.40–5.90)
RDW: 13.2 % (ref 11.5–14.5)
WBC: 9 10*3/uL (ref 3.8–10.6)

## 2017-01-06 LAB — COMPREHENSIVE METABOLIC PANEL
ALT: 112 U/L — ABNORMAL HIGH (ref 17–63)
ANION GAP: 11 (ref 5–15)
AST: 61 U/L — ABNORMAL HIGH (ref 15–41)
Albumin: 4.7 g/dL (ref 3.5–5.0)
Alkaline Phosphatase: 47 U/L (ref 38–126)
BUN: 12 mg/dL (ref 6–20)
CHLORIDE: 101 mmol/L (ref 101–111)
CO2: 26 mmol/L (ref 22–32)
Calcium: 10 mg/dL (ref 8.9–10.3)
Creatinine, Ser: 0.71 mg/dL (ref 0.61–1.24)
GFR calc non Af Amer: >60 mL/min (ref >60)
Glucose, Bld: 115 mg/dL — ABNORMAL HIGH (ref 65–99)
POTASSIUM: 3.8 mmol/L (ref 3.5–5.1)
SODIUM: 138 mmol/L (ref 135–145)
Total Bilirubin: 0.8 mg/dL (ref 0.3–1.2)
Total Protein: 8.1 g/dL (ref 6.5–8.1)

## 2017-01-06 LAB — URINE DRUG SCREEN, QUALITATIVE (ARMC ONLY)
AMPHETAMINES, UR SCREEN: NOT DETECTED
BENZODIAZEPINE, UR SCRN: NOT DETECTED
Barbiturates, Ur Screen: NOT DETECTED
CANNABINOID 50 NG, UR ~~LOC~~: NOT DETECTED
Cocaine Metabolite,Ur ~~LOC~~: NOT DETECTED
MDMA (Ecstasy)Ur Screen: NOT DETECTED
Methadone Scn, Ur: NOT DETECTED
OPIATE, UR SCREEN: NOT DETECTED
PHENCYCLIDINE (PCP) UR S: NOT DETECTED
TRICYCLIC, UR SCREEN: NOT DETECTED

## 2017-01-06 LAB — ETHANOL: Alcohol, Ethyl (B): <5 mg/dL (ref <5)

## 2017-01-06 LAB — SALICYLATE LEVEL

## 2017-01-06 LAB — ACETAMINOPHEN LEVEL

## 2017-01-06 NOTE — ED Notes (Signed)
Sandwich and soft drink given.  

## 2017-01-06 NOTE — ED Notes (Signed)
BEHAVIORAL HEALTH ROUNDING Patient sleeping: Yes.   Patient alert and oriented: not applicable SLEEPING Behavior appropriate: Yes.  ; If no, describe: SLEEPING Nutrition and fluids offered: No SLEEPING Toileting and hygiene offered: NoSLEEPING Sitter present: not applicable, Q 15 min safety rounds and observation via security camera. Law enforcement present: Yes ODS 

## 2017-01-06 NOTE — ED Notes (Signed)
BEHAVIORAL HEALTH ROUNDING  Patient sleeping: No.  Patient alert and oriented: yes  Behavior appropriate: Yes. ; If no, describe:  Nutrition and fluids offered: Yes  Toileting and hygiene offered: Yes  Sitter present: not applicable, Q 15 min safety rounds and observation via security camera. Law enforcement present: Yes ODS  

## 2017-01-06 NOTE — ED Triage Notes (Signed)
Pt stats he called [employer] stating that he was suicidal and that he would kill himself if he could get his hands on something to do it with.  Pt IVC at this time.

## 2017-01-06 NOTE — ED Notes (Signed)
[facility] placed in [facility]ts room and pt understanding about consult.

## 2017-01-06 NOTE — ED Notes (Signed)
ENVIRONMENTAL ASSESSMENT  Potentially harmful objects out of patient reach: Yes.  Personal belongings secured: Yes.  Patient dressed in hospital provided attire only: Yes.  Plastic bags out of patient reach: Yes.  Patient care equipment (cords, cables, call bells, lines, and drains) shortened, removed, or accounted for: Yes.  Equipment and supplies removed from bottom of stretcher: Yes.  Potentially toxic materials out of patient reach: Yes.  Sharps container removed or out of patient reach: Yes.   BEHAVIORAL HEALTH ROUNDING  Patient sleeping: No.  Patient alert and oriented: yes  Behavior appropriate: Yes. ; If no, describe:  Nutrition and fluids offered: Yes  Toileting and hygiene offered: Yes  Sitter present: not applicable, Q 15 min safety rounds and observation via security camera. Law enforcement present: Yes ODS  ED BHU PLACEMENT JUSTIFICATION  Is the patient under IVC or is there intent for IVC: Yes.  Is the patient medically cleared: Yes.  Is there vacancy in the ED BHU: Yes.  Is the population mix appropriate for patient: Yes.  Is the patient awaiting placement in inpatient or outpatient setting: Yes.  Has the patient had a psychiatric consult: No Pending SOC at this time.  Survey of unit performed for contraband, proper placement and condition of furniture, tampering with fixtures in bathroom, shower, and each patient room: Yes. ; Findings: All clear  APPEARANCE/BEHAVIOR  calm, cooperative and adequate rapport can be established  NEURO ASSESSMENT  Orientation: time, place and person  Hallucinations: No.None noted (Hallucinations)  Speech: Normal  Gait: normal  RESPIRATORY ASSESSMENT  WNL  CARDIOVASCULAR ASSESSMENT  WNL  GASTROINTESTINAL ASSESSMENT  WNL  EXTREMITIES  WNL  PLAN OF CARE  Provide calm/safe environment. Vital signs assessed TID. ED BHU Assessment once each 12-hour shift. Collaborate with TTS daily or as condition indicates. Assure the ED provider has  rounded once each shift. Provide and encourage hygiene. Provide redirection as needed. Assess for escalating behavior; address immediately and inform ED provider.  Assess family dynamic and appropriateness for visitation as needed: Yes. ; If necessary, describe findings:  Educate the patient/family about BHU procedures/visitation: Yes. ; If necessary, describe findings: Pt is calm and cooperative at this time. Pt understanding and accepting of unit procedures/rules. Will continue to monitor with Q 15 min safety rounds and observation via security camera.   Pt brought into ED BHU via sally port and wand with metal detector for safety by [employer] officer. Patient oriented to unit/care area: Pt informed of unit policies and procedures.  Informed that, for their safety, care areas are designed for safety and monitored by security cameras at all times; and visiting hours explained to patient. Patient verbalizes understanding, and verbal contract for safety obtained.Pt shown to their room.

## 2017-01-06 NOTE — ED Provider Notes (Signed)
[facility] Emergency Department Provider Note    ____________________________________________   I have reviewed the triage vital signs and the nursing notes.   HISTORY  Chief Complaint Suicidal   History limited by: Not Limited   HPI Kyle Wang is a 21 y.o. male who presents to the emergency department today because of concern for suicidal ideation. Patient states he has depression. He states that his depression has been worse since finding out about his stats pancreatic cancer diagnosis. However for the past week it is been even more intense. He cannot identify a particular trigger this past week. The patient does state that he has had thoughts of wanting to hurt himself. He states he has had suicidal ideation and attempts in the past. He denies any abnormal ingestions. Denies cutting himself recently. No current medical complaints.   Past Medical History:  Diagnosis Date  . Asthma   . Bipolar 1 disorder (HCC)   . Suicide [facility])     Patient Active Problem List   Diagnosis Date Noted  . Self-inflicted laceration of wrist 07/12/2016  . Borderline personality disorder 07/12/2016  . GERD (gastroesophageal reflux disease) 06/02/2016  . Tobacco use disorder 06/02/2016  . Asthma 06/02/2016  . Suicidal ideation 06/01/2016  . Personality disorder 06/01/2016  . Bipolar disorder (HCC) 06/01/2016    Past Surgical History:  Procedure Laterality Date  . CHOLECYSTECTOMY      Prior to Admission medications   Medication Sig Start Date End Date Taking? Authorizing Provider  azithromycin (ZITHROMAX Z-PAK) 250 MG tablet Take 2 tablets (500 mg) on  Day 1,  followed by 1 tablet (250 mg) once daily on Days 2 through 5. 12/02/16   Jami L Hagler, PA-C  clonazePAM (KLONOPIN) 0.5 MG tablet Take 1 tablet (0.5 mg total) by mouth 3 (three) times daily. 07/12/16   Audery Amel, MD  diphenhydrAMINE (BENADRYL) 50 MG capsule Take 1 capsule (50 mg total) by mouth at  bedtime. 08/08/16   Audery Amel, MD  divalproex (DEPAKOTE) 500 MG DR tablet Take 1 tablet (500 mg total) by mouth every 12 (twelve) hours. 08/08/16   Audery Amel, MD  escitalopram (LEXAPRO) 20 MG tablet Take 1 tablet (20 mg total) by mouth daily. 08/09/16   Audery Amel, MD  fluticasone (FLONASE) 50 MCG/ACT nasal spray Place 2 sprays into both nostrils daily. 12/02/16   Jami L Hagler, PA-C  ibuprofen (ADVIL,MOTRIN) 600 MG tablet Take 1 tablet (600 mg total) by mouth every 8 (eight) hours as needed. 08/11/16   Joni Reining, PA-C  ibuprofen (ADVIL,MOTRIN) 800 MG tablet Take 1 tablet (800 mg total) by mouth every 8 (eight) hours as needed. 05/30/16   Charmayne Sheer Beers, PA-C  OLANZapine (ZYPREXA) 20 MG tablet Take 1 tablet (20 mg total) by mouth at bedtime. 08/08/16   Audery Amel, MD  OLANZapine (ZYPREXA) 5 MG tablet Take 1 tablet (5 mg total) by mouth daily. 08/09/16   Audery Amel, MD  ondansetron (ZOFRAN ODT) 4 MG disintegrating tablet Take 1 tablet (4 mg total) by mouth every 8 (eight) hours as needed for nausea or vomiting. 10/18/16   Nita Sickle, MD  predniSONE (DELTASONE) 5 MG tablet Take 6 tablets (30 mg total) by mouth daily with breakfast. May take for up to 5 days.  Do not take any NSAIDs with this medication. Take with food. 12/02/16   Jami L Hagler, PA-C  promethazine-dextromethorphan (PROMETHAZINE-DM) 6.25-15 MG/5ML syrup Take 5 mLs by mouth 4 (  four) times daily as needed for cough. 12/02/16   Jami L Hagler, PA-C  ranitidine (ZANTAC) 150 MG tablet Take 1 tablet (150 mg total) by mouth 2 (two) times daily. 09/17/16 09/17/17  Phineas SemenGraydon Sadee Osland, MD  sucralfate (CARAFATE) 1 g tablet Take 1 tablet (1 g total) by mouth 4 (four) times daily. 09/17/16   Phineas SemenGraydon Kimberlee Shoun, MD  traMADol (ULTRAM) 50 MG tablet Take 1 tablet (50 mg total) by mouth every 6 (six) hours as needed for moderate pain. 08/11/16   Joni Reiningonald K Smith, PA-C    Allergies Patient has no known allergies.  Family History  Problem  Relation Age of Onset  . Adopted: Yes    Social History Social History  Substance Use Topics  . Smoking status: Former Smoker    Packs/day: 2.00    Types: Cigarettes    Quit date: 08/18/2013  . Smokeless tobacco: Current User  . Alcohol use No    Review of Systems  Constitutional: Negative for fever. Cardiovascular: Negative for chest pain. Respiratory: Negative for shortness of breath. Gastrointestinal: Negative for abdominal pain, vomiting and diarrhea. Neurological: Negative for headaches, focal weakness or numbness. Psychiatric: Positive for depression and suicidal ideation. 10-point ROS otherwise negative.  ____________________________________________   PHYSICAL EXAM:  VITAL SIGNS: ED Triage Vitals  Enc Vitals Group     BP 01/06/17 1546 (!) 140/97     Pulse Rate 01/06/17 1546 81     Resp 01/06/17 1546 20     Temp 01/06/17 1546 99.4 F (37.4 C)     Temp Source 01/06/17 1546 Oral     SpO2 01/06/17 1546 98 %     Weight 01/06/17 1547 230 lb (104.3 kg)     Height 01/06/17 1547 5\' 10"  (1.778 m)    Constitutional: Alert and oriented.  Eyes: Conjunctivae are normal. Normal extraocular movements. ENT   Head: Normocephalic and atraumatic.   Nose: No congestion/rhinnorhea.   Mouth/Throat: Mucous membranes are moist.   Neck: No stridor. Hematological/Lymphatic/Immunilogical: No cervical lymphadenopathy. Cardiovascular: Normal rate, regular rhythm.  No murmurs, rubs, or gallops.  Respiratory: Normal respiratory effort without tachypnea nor retractions. Breath sounds are clear and equal bilaterally. No wheezes/rales/rhonchi. Gastrointestinal: Soft and non tender. No rebound. No guarding.  Genitourinary: Deferred Musculoskeletal: Normal range of motion in all extremities. No lower extremity edema. Neurologic:  Normal speech and language. No gross focal neurologic deficits are appreciated.  Skin:  Skin is warm, dry and intact. No rash noted. Psychiatric:  Depressed. Endorses SI.  ____________________________________________    LABS (pertinent positives/negatives)  Labs Reviewed  COMPREHENSIVE METABOLIC PANEL - Abnormal; Notable for the following:       Result Value   Glucose, Bld 115 (*)    AST 61 (*)    ALT 112 (*)    All other components within normal limits  ACETAMINOPHEN LEVEL - Abnormal; Notable for the following:    Acetaminophen (Tylenol), Serum <10 (*)    All other components within normal limits  ETHANOL  SALICYLATE LEVEL  CBC  URINE DRUG SCREEN, QUALITATIVE ([facility] ONLY)     ____________________________________________   EKG  None  ____________________________________________    RADIOLOGY  None  ____________________________________________   PROCEDURES  Procedures  ____________________________________________   INITIAL IMPRESSION / ASSESSMENT AND PLAN / ED COURSE  Pertinent labs & imaging results that were available during my care of the patient were reviewed by me and considered in my medical decision making (see chart for details).  Patient presented to the emergency department today under  IVC because of concerns for depression and suicidal ideation. On exam patient is depressed. He does endorse suicidal ideation. Will have psychiatry evaluate patient.  ____________________________________________   FINAL CLINICAL IMPRESSION(S) / ED DIAGNOSES  Final diagnoses:  Depression, unspecified depression type     Note: This dictation was prepared with Dragon dictation. Any transcriptional errors that result from this process are unintentional     Phineas Semen, MD 01/06/17 2257
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
## 2017-01-07 MED ORDER — DIPHENHYDRAMINE HCL 25 MG PO CAPS
50.0000 mg | ORAL_CAPSULE | Freq: Every day | ORAL | Status: DC
  Administered 2017-01-07 – 2017-01-13 (×7): 50 mg via ORAL
  Filled 2017-01-07 (×7): qty 2

## 2017-01-07 MED ORDER — OLANZAPINE 5 MG PO TABS
5.0000 mg | ORAL_TABLET | Freq: Every day | ORAL | Status: DC
  Administered 2017-01-08 – 2017-01-14 (×7): 5 mg via ORAL
  Filled 2017-01-07 (×7): qty 1

## 2017-01-07 MED ORDER — ESCITALOPRAM OXALATE 10 MG PO TABS
20.0000 mg | ORAL_TABLET | Freq: Every day | ORAL | Status: DC
Start: 2017-01-08 — End: 2017-01-14
  Administered 2017-01-08 – 2017-01-14 (×7): 20 mg via ORAL
  Filled 2017-01-07 (×8): qty 2

## 2017-01-07 MED ORDER — OLANZAPINE 10 MG PO TABS
20.0000 mg | ORAL_TABLET | Freq: Every day | ORAL | Status: DC
  Administered 2017-01-07 – 2017-01-13 (×7): 20 mg via ORAL
  Filled 2017-01-07 (×8): qty 2

## 2017-01-07 MED ORDER — CLONAZEPAM 0.5 MG PO TABS
0.5000 mg | ORAL_TABLET | Freq: Three times a day (TID) | ORAL | Status: DC | PRN
  Administered 2017-01-10 – 2017-01-13 (×6): 0.5 mg via ORAL
  Filled 2017-01-07 (×6): qty 1

## 2017-01-07 MED ORDER — CLONAZEPAM 0.5 MG PO TABS: 0.5000 mg | ORAL_TABLET | Freq: Three times a day (TID) | ORAL | Status: DC

## 2017-01-07 MED ORDER — ALBUTEROL SULFATE HFA 108 (90 BASE) MCG/ACT IN AERS
2.0000 | INHALATION_SPRAY | Freq: Two times a day (BID) | RESPIRATORY_TRACT | Status: DC
  Filled 2017-01-07: qty 6.7

## 2017-01-07 MED ORDER — ALBUTEROL SULFATE HFA 108 (90 BASE) MCG/ACT IN AERS
2.0000 | INHALATION_SPRAY | Freq: Two times a day (BID) | RESPIRATORY_TRACT | Status: DC | PRN
  Filled 2017-01-07: qty 6.7

## 2017-01-07 MED ORDER — DIVALPROEX SODIUM 500 MG PO DR TAB
500.0000 mg | DELAYED_RELEASE_TABLET | Freq: Two times a day (BID) | ORAL | Status: DC
  Administered 2017-01-07 – 2017-01-14 (×14): 500 mg via ORAL
  Filled 2017-01-07 (×14): qty 1

## 2017-01-07 NOTE — ED Notes (Signed)
Patient taking a shower.

## 2017-01-07 NOTE — ED Notes (Signed)
BEHAVIORAL HEALTH ROUNDING Patient sleeping: Yes.   Patient alert and oriented: not applicable SLEEPING Behavior appropriate: Yes.  ; If no, describe: SLEEPING Nutrition and fluids offered: No SLEEPING Toileting and hygiene offered: NoSLEEPING Sitter present: not applicable, Q 15 min safety rounds and observation via security camera. Law enforcement present: Yes ODS 

## 2017-01-07 NOTE — ED Notes (Signed)
Patient asleep in room. No noted distress or abnormal behavior. Will continue 15 minute checks and observation by security cameras for safety.Patient asleep in room. No noted distress or abnormal behavior. Will continue 15 minute checks and observation by security cameras for safety.

## 2017-01-07 NOTE — ED Notes (Signed)
Patient sitting in dayroom. Social with peers and staff. He states he called the police to come get him at his group home because he felt suicidal. He says he still feels the same and needs to be hospitalized. Patient believes the group home will take him back.Maintained on 15 minute checks and observation by security camera for safety.

## 2017-01-07 NOTE — ED Notes (Signed)
Patient in dayroom. Maintained on 15 minute checks and observation by security camera for safety.

## 2017-01-07 NOTE — ED Notes (Signed)

## 2017-01-07 NOTE — ED Provider Notes (Signed)
-----------------------------------------   7:57 AM on 01/07/2017 -----------------------------------------   Blood pressure 112/79, pulse 77, temperature 98 F (36.7 C), temperature source Oral, resp. rate 18, height 5\' 10"  (1.778 m), weight 230 lb (104.3 kg), SpO2 100 %.  The patient had no acute events since last update.  Calm and cooperative at this time.  Disposition is pending Psychiatry/Behavioral Medicine team recommendations.     Sharman CheekPhillip Melena Hayes, MD 01/07/17 [phone]

## 2017-01-07 NOTE — BH Assessment (Addendum)
Writer followed up with referrals for Psych Inpatient;   [facility] [phone]), left a message requesting a return phone call.   [facility] ([phone]), Declined due to no bed availability. They are having construction work and the part of their unit that is for patients with psychosis is  Nonoperational at this time.   [facility] ([phone]), Wait List   [facility] ([phone]), Pending review. Was instructed to call back.   [facility] ([phone]), Declined, they do not take adult Medicaid.   [facility] [phone]), left a message requesting a return phone call.

## 2017-01-07 NOTE — ED Notes (Signed)
Patient received meal tray and beverage. 

## 2017-01-07 NOTE — ED Notes (Signed)
Patient in dayroom in NAD. Maintained on 15 minute checks and observation by security camera for safety. 

## 2017-01-07 NOTE — ED Notes (Signed)
Patient has remained in behavioral control. Maintained on 15 minute checks and observation by security camera for safety.
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
## 2017-01-07 NOTE — ED Notes (Signed)

## 2017-01-07 NOTE — BHH Counselor (Signed)
Referral information for Psychiatric Hospitalization faxed to:  [facility] - [phone] or [phone]  [facility] - [phone]  [facility] - [phone] or [phone]  [facility] - [phone]  [facility] - [phone]  [facility] - [phone] 

## 2017-01-07 NOTE — ED Notes (Signed)
Pt. Alert and oriented, warm and dry, in no distress. Pt. Denies SI, HI, and AVH. Pt. Encouraged to let nursing staff know of any concerns or needs. 

## 2017-01-07 NOTE — ED Notes (Signed)
Maintained on 15 minute checks and observation by security camera for safety. 

## 2017-01-07 NOTE — ED Notes (Signed)

## 2017-01-07 NOTE — ED Notes (Signed)
Patient alert and oriented. Patient states he is feeling suicidal at the group home. He does feel safe in the hospital. Patient received meal tray and beverage.

## 2017-01-07 NOTE — ED Notes (Signed)
Patient received meal tray and beverage. Maintained on 15 minute checks and observation by security camera for safety. 

## 2017-01-08 NOTE — ED Notes (Signed)
Patient awake and watching television in the common area. All safety precautions maintained.

## 2017-01-08 NOTE — ED Notes (Signed)
Patient asleep in room. No noted distress or abnormal behavior. Will continue 15 minute checks and observation by security cameras for safety. 

## 2017-01-08 NOTE — ED Provider Notes (Signed)
-----------------------------------------   6:17 AM on 01/08/2017 -----------------------------------------   Blood pressure 132/82, pulse 67, temperature 98.1 F (36.7 C), temperature source Oral, resp. rate 18, height 5\' 10"  (1.778 m), weight 230 lb (104.3 kg), SpO2 97 %.  The patient had no acute events since last update.  Calm and cooperative at this time.  Disposition is pending Psychiatry/Behavioral Medicine team recommendations.     Irean HongJade J Sung, MD 01/08/17 [phone]

## 2017-01-08 NOTE — Progress Notes (Signed)
LCSW and TTS consulted- This patient awaits bed offer for adult psych unit-for inpatient stay.   Cohen Doleman LCSW  

## 2017-01-08 NOTE — ED Notes (Signed)
Patient received meal tray and beverage. Maintained on 15 minute checks and observation by security camera for safety. 

## 2017-01-08 NOTE — ED Notes (Signed)
Patient asleep. Breakfast tray left at bedside. Maintained on 15 minute checks and observation by security camera for safety. 

## 2017-01-08 NOTE — ED Notes (Signed)
Patient awake, cooperative with nursing interventions. He denies suicidal thoughts. Maintained on 15 minute checks and observation by security camera for safety.

## 2017-01-08 NOTE — ED Notes (Signed)
Maintained on 15 minute checks and observation by security camera for safety. 

## 2017-01-08 NOTE — ED Notes (Signed)

## 2017-01-08 NOTE — ED Notes (Signed)
Patient received meal tray and beverage. 

## 2017-01-08 NOTE — ED Notes (Signed)
Patient social with peers and staff. Appears to be in good spirits.

## 2017-01-09 DIAGNOSIS — F603 Borderline personality disorder: Secondary | ICD-10-CM

## 2017-01-09 NOTE — ED Notes (Signed)
Pt  wanting to be admitted to BMU. Pt stated. "If I'm not admitted I'm not afraid.  Ill do it. Ill kill myself."   Psychiatrist made aware.  Maintained on 15 minute checks and observation by security camera for safety.

## 2017-01-09 NOTE — ED Notes (Signed)
Pt received call from his adoptive mother (legal guardian) Reece LeaderRosemarie Ogawa.  Pt calm while on the phone. Maintained on 15 minute checks and observation by security camera for safety.

## 2017-01-09 NOTE — ED Notes (Signed)
BEHAVIORAL HEALTH ROUNDING  Patient sleeping: No.  Patient alert and oriented: yes  Behavior appropriate: Yes. ; If no, describe:  Nutrition and fluids offered: Yes  Toileting and hygiene offered: Yes  Sitter present: not applicable, Q 15 min safety rounds and observation via security camera. Law enforcement present: Yes ODS  

## 2017-01-09 NOTE — ED Notes (Signed)
Pt ate 100% of lunch
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
## 2017-01-09 NOTE — ED Notes (Signed)
Pt watching TV in dayroom with other patients. Pt is calm and cooperative. No needs or concerns voiced at this time. Maintained on 15 minute checks and observation by security camera for safety.

## 2017-01-09 NOTE — ED Notes (Signed)
Patient asleep in room. No noted distress or abnormal behavior. Will continue 15 minute checks and observation by security cameras for safety. 

## 2017-01-09 NOTE — ED Notes (Signed)
ENVIRONMENTAL ASSESSMENT  Potentially harmful objects out of patient reach: Yes.  Personal belongings secured: Yes.  Patient dressed in hospital provided attire only: Yes.  Plastic bags out of patient reach: Yes.  Patient care equipment (cords, cables, call bells, lines, and drains) shortened, removed, or accounted for: Yes.  Equipment and supplies removed from bottom of stretcher: Yes.  Potentially toxic materials out of patient reach: Yes.  Sharps container removed or out of patient reach: Yes.   BEHAVIORAL HEALTH ROUNDING  Patient sleeping: No.  Patient alert and oriented: yes  Behavior appropriate: Yes. ; If no, describe:  Nutrition and fluids offered: Yes  Toileting and hygiene offered: Yes  Sitter present: not applicable, Q 15 min safety rounds and observation via security camera. Law enforcement present: Yes ODS  ED BHU PLACEMENT JUSTIFICATION  Is the patient under IVC or is there intent for IVC: Yes.  Is the patient medically cleared: Yes.  Is there vacancy in the ED BHU: Yes.  Is the population mix appropriate for patient: Yes.  Is the patient awaiting placement in inpatient or outpatient setting: Yes.  Has the patient had a psychiatric consult: Yes.  Survey of unit performed for contraband, proper placement and condition of furniture, tampering with fixtures in bathroom, shower, and each patient room: Yes. ; Findings: All clear  APPEARANCE/BEHAVIOR  calm, cooperative and adequate rapport can be established  NEURO ASSESSMENT  Orientation: time, place and person  Hallucinations: No.None noted (Hallucinations)  Speech: Normal  Gait: normal  RESPIRATORY ASSESSMENT  WNL  CARDIOVASCULAR ASSESSMENT  WNL  GASTROINTESTINAL ASSESSMENT  WNL  EXTREMITIES  WNL  PLAN OF CARE  Provide calm/safe environment. Vital signs assessed TID. ED BHU Assessment once each 12-hour shift. Collaborate with TTS daily or as condition indicates. Assure the ED provider has rounded once each shift.  Provide and encourage hygiene. Provide redirection as needed. Assess for escalating behavior; address immediately and inform ED provider.  Assess family dynamic and appropriateness for visitation as needed: Yes. ; If necessary, describe findings:  Educate the patient/family about BHU procedures/visitation: Yes. ; If necessary, describe findings: Pt is calm and cooperative at this time. Pt understanding and accepting of unit procedures/rules. Will continue to monitor with Q 15 min safety rounds and observation via security camera.     

## 2017-01-09 NOTE — ED Provider Notes (Signed)
-----------------------------------------   6:54 AM on 01/09/2017 -----------------------------------------   Blood pressure 112/68, pulse 66, temperature 97.6 F (36.4 C), temperature source Oral, resp. rate 18, height 5\' 10"  (1.778 m), weight 230 lb (104.3 kg), SpO2 100 %.  The patient had no acute events since last update.  Calm and cooperative at this time.  Disposition is pending Psychiatry/Behavioral Medicine team recommendations.     Irean HongJade J Niel Peretti, MD 01/09/17 [phone]

## 2017-01-09 NOTE — Consult Note (Signed)
[facility] Face-to-Face Psychiatry Consult   Reason for Consult:  Consult to follow-up with this 21 year old man with a history of mood disorder and behavior problems Referring Physician:  Roxan Hockey Patient Identification: CHRISANGEL ESKENAZI MRN:  [id number] Principal Diagnosis: Borderline personality disorder Diagnosis:   Patient Active Problem List   Diagnosis Date Noted  . Self-inflicted laceration of wrist [S61.519A] 07/12/2016  . Borderline personality disorder [F60.3] 07/12/2016  . GERD (gastroesophageal reflux disease) [K21.9] 06/02/2016  . Tobacco use disorder [F17.200] 06/02/2016  . Asthma [J45.909] 06/02/2016  . Suicidal ideation [R45.851] 06/01/2016  . Personality disorder [F60.9] 06/01/2016  . Bipolar disorder (HCC) [F31.9] 06/01/2016    Total Time spent with patient: 1 hour  Subjective:   MOHAMEDAMIN NIFONG is a 21 y.o. male patient admitted with "I think I need to be in an institution".  HPI:  Patient interviewed. Chart reviewed. 21 year old man with a history of personality disorder mood disorder impulsivity. He comes in saying that he has had symptoms of mood instability that of been present for many months at least since Thanksgiving. For reasons that are unclear things of gotten worse recently. He tells me that while he has not actually cut himself he thinks that if he were not in the hospital he would certainly come to a decision about hurting himself and then would be a danger to himself. He does not have any specific wish to die right now. He says he thinks he needs his medicines adjusted because he hasn't been sleeping very well. Patient was unable to clarify exactly why things might be worse recently. He cites worrying about his father's illness being a major stress. Denies currently having any auditory or visual hallucinations. Collateral information from his group home is that he is banned from the group home having reportedly stolen money from somebody there and also  recently got kicked out of his day program.  Social history: Has been living in a group home. Long-standing behavior problems that have severely impaired his ability to develop.  Medical history: Other than his mental health issues no significant medical problems  Substance abuse history: Denies that he's been using any drugs or alcohol  Past Psychiatric History: Patient has a long history of mental health issues self-mutilation multiple hospitalizations. History of clear manipulativeness when he is in a difficult situation to try and get himself in the hospitals. Currently on mood stabilizers and antipsychotics.  Risk to Self: Suicidal Ideation: Yes-Currently Present Suicidal Intent: Yes-Currently Present Is patient at risk for suicide?: Yes Suicidal Plan?: Yes-Currently Present Specify Current Suicidal Plan: "Over dosing or hang myself." Access to Means: Yes Specify Access to Suicidal Means: "Can use my clothes" What has been your use of drugs/alcohol within the last 12 months?: None How many times?: 5 Other Self Harm Risks: History of cutting Triggers for Past Attempts: None known Intentional Self Injurious Behavior: None Risk to Others: Homicidal Ideation: No Thoughts of Harm to Others: No Current Homicidal Intent: No Current Homicidal Plan: No Access to Homicidal Means: No Identified Victim: Reports of none History of harm to others?: No Violent Behavior Description: Reports of none Does patient have access to weapons?: No Criminal Charges Pending?: Yes Describe Pending Criminal Charges: Stole Three Thousands Does patient have a court date: No Prior Inpatient Therapy: Prior Inpatient Therapy: Yes Prior Therapy Dates: Multiple Psychiatric Hospitalizations Prior Therapy Facilty/Provider(s): Multiple Psychiatric Hospitalizations Reason for Treatment: Depression Prior Outpatient Therapy: Prior Outpatient Therapy: Yes Prior Therapy Dates: Current Prior Therapy  Facilty/Provider(s): [facility] Reason for  Treatment: Depression Does patient have an ACCT team?: No Does patient have Intensive In-House Services?  : No Does patient have [name] services? : No Does patient have P4CC services?: No  Past Medical History:  Past Medical History:  Diagnosis Date  . Asthma   . Bipolar 1 disorder (HCC)   . Suicide [facility])     Past Surgical History:  Procedure Laterality Date  . CHOLECYSTECTOMY     Family History:  Family History  Problem Relation Age of Onset  . Adopted: Yes   Family Psychiatric  History: No known family history Social History:  History  Alcohol Use No     History  Drug Use No    Social History   Social History  . Marital status: Single    Spouse name: N/A  . Number of children: N/A  . Years of education: N/A   Social History Main Topics  . Smoking status: Former Smoker    Packs/day: 2.00    Types: Cigarettes    Quit date: 08/18/2013  . Smokeless tobacco: Current User  . Alcohol use No  . Drug use: No  . Sexual activity: Yes   Other Topics Concern  . None   Social History Narrative  . None   Additional Social History:    Allergies:  No Known Allergies  Labs: No results found for this or any previous visit (from the past 48 hour(s)).  Current Facility-Administered Medications  Medication Dose Route Frequency Provider Last Rate Last Dose  . albuterol (PROVENTIL HFA;VENTOLIN HFA) 108 (90 Base) MCG/ACT inhaler 2 puff  2 puff Inhalation BID PRN Willy Eddy, MD      . clonazePAM Scarlette Calico) tablet 0.5 mg  0.5 mg Oral TID PRN Willy Eddy, MD      . diphenhydrAMINE (BENADRYL) capsule 50 mg  50 mg Oral QHS Willy Eddy, MD   50 mg at 01/08/17 2124  . divalproex (DEPAKOTE) DR tablet 500 mg  500 mg Oral Q12H Willy Eddy, MD   500 mg at 01/09/17 1016  . escitalopram (LEXAPRO) tablet 20 mg  20 mg Oral Daily Willy Eddy, MD   20 mg at 01/09/17 1016  . OLANZapine (ZYPREXA) tablet 20 mg  20 mg Oral QHS  Willy Eddy, MD   20 mg at 01/08/17 2123  . OLANZapine (ZYPREXA) tablet 5 mg  5 mg Oral Daily Willy Eddy, MD   5 mg at 01/09/17 1019   Current Outpatient Prescriptions  Medication Sig Dispense Refill  . albuterol (PROVENTIL HFA;VENTOLIN HFA) 108 (90 Base) MCG/ACT inhaler Inhale 2 puffs into the lungs 2 (two) times daily.    . Cholecalciferol (VITAMIN D-1000 MAX ST) 1000 units tablet Take 1,000 Units by mouth daily.    . divalproex (DEPAKOTE) 500 MG DR tablet Take 1 tablet (500 mg total) by mouth every 12 (twelve) hours. 60 tablet 1  . escitalopram (LEXAPRO) 20 MG tablet Take 1 tablet (20 mg total) by mouth daily. 30 tablet 1  . fluticasone (FLONASE) 50 MCG/ACT nasal spray Place 2 sprays into both nostrils daily. 16 g 0  . ibuprofen (ADVIL,MOTRIN) 600 MG tablet Take 1 tablet (600 mg total) by mouth every 8 (eight) hours as needed. 15 tablet 0  . OLANZapine (ZYPREXA) 20 MG tablet Take 1 tablet (20 mg total) by mouth at bedtime. 30 tablet 1  . OLANZapine (ZYPREXA) 5 MG tablet Take 1 tablet (5 mg total) by mouth daily. 30 tablet 1  . ranitidine (ZANTAC) 150 MG tablet Take 1 tablet (150  mg total) by mouth 2 (two) times daily. 60 tablet 1  . clonazePAM (KLONOPIN) 0.5 MG tablet Take 1 tablet (0.5 mg total) by mouth 3 (three) times daily. (Patient not taking: Reported on 01/06/2017) 90 tablet 0  . diphenhydrAMINE (BENADRYL) 50 MG capsule Take 1 capsule (50 mg total) by mouth at bedtime. (Patient not taking: Reported on 01/06/2017) 30 capsule 1  . ibuprofen (ADVIL,MOTRIN) 800 MG tablet Take 1 tablet (800 mg total) by mouth every 8 (eight) hours as needed. (Patient not taking: Reported on 01/06/2017) 30 tablet 0  . ondansetron (ZOFRAN ODT) 4 MG disintegrating tablet Take 1 tablet (4 mg total) by mouth every 8 (eight) hours as needed for nausea or vomiting. (Patient not taking: Reported on 01/06/2017) 20 tablet 0  . predniSONE (DELTASONE) 5 MG tablet Take 6 tablets (30 mg total) by mouth daily with  breakfast. May take for up to 5 days.  Do not take any NSAIDs with this medication. Take with food. (Patient not taking: Reported on 01/06/2017) 30 tablet 0  . promethazine-dextromethorphan (PROMETHAZINE-DM) 6.25-15 MG/5ML syrup Take 5 mLs by mouth 4 (four) times daily as needed for cough. (Patient not taking: Reported on 01/06/2017) 118 mL 0  . sucralfate (CARAFATE) 1 g tablet Take 1 tablet (1 g total) by mouth 4 (four) times daily. (Patient not taking: Reported on 01/06/2017) 60 tablet 0  . traMADol (ULTRAM) 50 MG tablet Take 1 tablet (50 mg total) by mouth every 6 (six) hours as needed for moderate pain. (Patient not taking: Reported on 01/06/2017) 12 tablet 0    Musculoskeletal: Strength & Muscle Tone: within normal limits Gait & Station: normal Patient leans: N/A  Psychiatric Specialty Exam: Physical Exam  Nursing note and vitals reviewed. Constitutional: He appears well-developed and well-nourished.  HENT:  Head: Normocephalic and atraumatic.  Eyes: Conjunctivae are normal. Pupils are equal, round, and reactive to light.  Neck: Normal range of motion.  Cardiovascular: Normal heart sounds.   Respiratory: Effort normal.  GI: Soft.  Musculoskeletal: Normal range of motion.  Neurological: He is alert.  Skin: Skin is warm and dry.  Psychiatric: His speech is normal and behavior is normal. Thought content normal. His mood appears anxious. Cognition and memory are normal. He expresses impulsivity and inappropriate judgment.    Review of Systems  Constitutional: Negative.   HENT: Negative.   Eyes: Negative.   Respiratory: Negative.   Cardiovascular: Negative.   Gastrointestinal: Negative.   Musculoskeletal: Negative.   Skin: Negative.   Neurological: Negative.   Psychiatric/Behavioral: Negative for depression, hallucinations, memory loss, substance abuse and suicidal ideas. The patient is nervous/anxious and has insomnia.     Blood pressure 112/68, pulse 66, temperature 97.6 F (36.4  C), temperature source Oral, resp. rate 18, height 5\' 10"  (1.778 m), weight 104.3 kg (230 lb), SpO2 100 %.Body mass index is 33 kg/m.  General Appearance: Disheveled  Eye Contact:  Good  Speech:  Normal Rate  Volume:  Normal  Mood:  Dysphoric  Affect:  Congruent  Thought Process:  Goal Directed  Orientation:  Full (Time, Place, and Person)  Thought Content:  Logical  Suicidal Thoughts:  Yes.  without intent/plan  Homicidal Thoughts:  No  Memory:  Immediate;   Good Recent;   Fair Remote;   Fair  Judgement:  Impaired  Insight:  Shallow  Psychomotor Activity:  Normal  Concentration:  Concentration: Fair  Recall:  [employer] of Knowledge:  Fair  Language:  Fair  Akathisia:  No  Handed:  Right  AIMS (if indicated):     Assets:  ArchitectCommunication Skills Financial Resources/Insurance Housing Physical Health  ADL's:  Intact  Cognition:  WNL  Sleep:        Treatment Plan Summary: Medication management and Plan 21 year old man with chronic mood instability and behavior problems. Didn't actually do anything to hurt himself and interestingly is not actually saying that he is suicidal but is instead saying that if we were to send him out he thinks he would certainly then become suicidal. Situation strongly suggest that he is trying to get out of being back at his group home. Patient is not likely to benefit from inpatient hospitalization. Continue outpatient medicines. We will work on either referral outdoor referral to a different living situation.  Disposition: No evidence of imminent risk to self or others at present.   Supportive therapy provided about ongoing stressors. Discussed crisis plan, support from social network, calling 911, coming to the Emergency Department, and calling Suicide Hotline.  Mordecai RasmussenJohn Dela Sweeny, MD 01/09/2017 2:14 PM

## 2017-01-09 NOTE — ED Notes (Signed)
BEHAVIORAL HEALTH ROUNDING Patient sleeping: Yes.   Patient alert and oriented: not applicable SLEEPING Behavior appropriate: Yes.  ; If no, describe: SLEEPING Nutrition and fluids offered: No SLEEPING Toileting and hygiene offered: NoSLEEPING Sitter present: not applicable, Q 15 min safety rounds and observation via security camera. Law enforcement present: Yes ODS 

## 2017-01-10 ENCOUNTER — Encounter: Payer: Self-pay | Admitting: Radiology

## 2017-01-10 ENCOUNTER — Emergency Department: Payer: BLUE CROSS/BLUE SHIELD

## 2017-01-10 LAB — URINALYSIS, COMPLETE (UACMP) WITH MICROSCOPIC
BILIRUBIN URINE: NEGATIVE
Bacteria, UA: NONE SEEN
Glucose, UA: 150 mg/dL — AB
Hgb urine dipstick: NEGATIVE
Ketones, ur: NEGATIVE mg/dL
Leukocytes, UA: NEGATIVE
NITRITE: NEGATIVE
PH: 7 (ref 5.0–8.0)
Protein, ur: NEGATIVE mg/dL
RBC / HPF: NONE SEEN RBC/hpf (ref 0–5)
SPECIFIC GRAVITY, URINE: 1.003 — AB (ref 1.005–1.030)
SQUAMOUS EPITHELIAL / LPF: NONE SEEN
WBC UA: NONE SEEN WBC/hpf (ref 0–5)

## 2017-01-10 MED ORDER — SENNA 8.6 MG PO TABS
2.0000 | ORAL_TABLET | Freq: Once | ORAL | Status: DC
  Filled 2017-01-10: qty 2

## 2017-01-10 MED ORDER — IOPAMIDOL (ISOVUE-300) INJECTION 61%
100.0000 mL | Freq: Once | INTRAVENOUS | Status: AC | PRN
  Administered 2017-01-10: 30 mL via INTRAVENOUS
  Filled 2017-01-10: qty 100

## 2017-01-10 MED ORDER — HALOPERIDOL LACTATE 5 MG/ML IJ SOLN
5.0000 mg | Freq: Once | INTRAMUSCULAR | Status: AC
  Administered 2017-01-10: 5 mg via INTRAMUSCULAR
  Filled 2017-01-10: qty 1

## 2017-01-10 MED ORDER — SENNOSIDES-DOCUSATE SODIUM 8.6-50 MG PO TABS
ORAL_TABLET | ORAL | Status: AC
  Filled 2017-01-10: qty 2

## 2017-01-10 MED ORDER — IOPAMIDOL (ISOVUE-300) INJECTION 61%
30.0000 mL | Freq: Once | INTRAVENOUS | Status: AC | PRN
  Administered 2017-01-10: 100 mL via ORAL
  Filled 2017-01-10: qty 30

## 2017-01-10 NOTE — ED Notes (Signed)
BEHAVIORAL HEALTH ROUNDING  Patient sleeping: No.  Patient alert and oriented: yes  Behavior appropriate: Yes. ; If no, describe:  Nutrition and fluids offered: Yes  Toileting and hygiene offered: Yes  Sitter present: not applicable, Q 15 min safety rounds and observation via security camera. Law enforcement present: Yes ODS Pt remains awake and is restless and a little anxious. Requesting medications. Will give prn klonopin.

## 2017-01-10 NOTE — ED Notes (Signed)
Pt complaining of RLQ pain 9/10. Severe rebound pain. ER MD made aware and will come check pt.  Pt laying in bed. VS taken. Maintained on 15 minute checks and observation by security camera for safety.

## 2017-01-10 NOTE — ED Notes (Signed)
BEHAVIORAL HEALTH ROUNDING Patient sleeping: Yes.   Patient alert and oriented: not applicable SLEEPING Behavior appropriate: Yes.  ; If no, describe: SLEEPING Nutrition and fluids offered: No SLEEPING Toileting and hygiene offered: NoSLEEPING Sitter present: not applicable, Q 15 min safety rounds and observation via security camera. Law enforcement present: Yes ODS 

## 2017-01-10 NOTE — ED Notes (Signed)
BEHAVIORAL HEALTH ROUNDING  Patient sleeping: No.  Patient alert and oriented: yes  Behavior appropriate: Yes. ; If no, describe:  Nutrition and fluids offered: Yes  Toileting and hygiene offered: Yes  Sitter present: not applicable, Q 15 min safety rounds and observation via security camera. Law enforcement present: Yes ODS  

## 2017-01-10 NOTE — ED Notes (Signed)
Patient asleep in room. No noted distress or abnormal behavior. Will continue 15 minute checks and observation by security cameras for safety. 

## 2017-01-10 NOTE — BH Assessment (Signed)
Writer [facility] with [facility] [name]). Patient is still unable to return to their facility. He was discharged from their home, the night the day he came to the ER. His belongings are with his mother/guardian. Group Home states, the patient charges are still pending to be served. Due to them being felony charges, [place] [place] [facility]. Group Home also states, "the person who serves the papers was out today (01/10/2017)" and are expected to be served on tomorrow (01/11/2017).

## 2017-01-10 NOTE — ED Notes (Signed)
Pt requesting medication for elevated anxiety.  Pt given PRN medication as ordered. RN will follow up with pt. Maintained on 15 minute checks and observation by security camera for safety.

## 2017-01-10 NOTE — ED Provider Notes (Signed)
CT scan is negative for acute pathology. The patient remains medically stable for psychiatric evaluation.   Merrily Brittle, MD 01/10/17 [phone]

## 2017-01-10 NOTE — ED Notes (Signed)
Pt pleasant and cooperative. Pt states he would not be safe if he were to leave the hospital. Denies HI and AVH.  Pt compliant with medications. Pt inquiring about any placement updates. RN told pt shi would keep him updated on any news. No further concerns or needs. Maintained on 15 minute checks and observation by security camera for safety.

## 2017-01-10 NOTE — ED Notes (Signed)
ER MD came to examine pt. CT scan will be done. Pt told not to eat or drink before procedure. Pt accepting. Maintained on 15 minute checks and observation by security camera for safety.

## 2017-01-10 NOTE — ED Provider Notes (Signed)
-----------------------------------------   8:01 AM on 01/10/2017 -----------------------------------------   Blood pressure 123/79, pulse 68, temperature 97.5 F (36.4 C), temperature source Oral, resp. rate 18, height 5\' 10"  (1.778 m), weight 230 lb (104.3 kg), SpO2 100 %.  The patient had no acute events since last update.  Calm and cooperative at this time.  Plan is for no inpatient hospitalization but needs referral to a different living situation.     Rebecka ApleyAllison P Elisabeth Strom, MD 01/10/17 [phone]

## 2017-01-10 NOTE — ED Notes (Signed)
Pt complaining of abdominal pain 9/10. Medication administered as ordered by ER MD. Maintained on 15 minute checks and observation by security camera for safety.

## 2017-01-10 NOTE — ED Notes (Signed)
Pt prepped for CT scan.  Pt will be escorted by officer and RN. Officer will stay with pt. Pt accepting. Maintained on 15 minute checks and observation by security camera for safety.

## 2017-01-10 NOTE — ED Notes (Signed)
Pt currently rates pain 0/10. Pt remains calm and cooperative. Maintained on 15 minute checks and observation by security camera for safety.

## 2017-01-10 NOTE — ED Provider Notes (Signed)
-----------------------------------------   2:26 PM on 01/10/2017 -----------------------------------------   Blood pressure 123/79, pulse 68, temperature 97.5 F (36.4 C), temperature source Oral, resp. rate 18, height 5\' 10"  (1.778 m), weight 230 lb (104.3 kg), SpO2 100 %.  [facility] was complaining of abdominal pain. Patient reports that he woke up from his nap this afternoon and had severe sharp sudden onset of RLQ abdominal pain. No vomiting, nausea, or diarrhea. No fever. Patient is currently complaining of 9/10 pain however is sitting down playing cards with the other patients in the B[facility]. His only prior abdominal surgery was cholecystectomy. He reports dysuria, denies h/o kidney stones, denies penile discharge, or testicular pain.  On exam extremely well appearing, in no distress, has normal vital signs, abdomen is soft with localized tenderness to palpation in the right lower quadrant, no rebound or guarding. GU exam is normal showing bilateral testicles descended with no tenderness to palpation, bilateral positive cremasteric reflexes, no swelling or erythema of the scrotum. No evidence of inguinal hernia.  No evidence of testicular torsion. UA with no evidence of UTI or blood. Patient made NPO. Will get CT a/P.   _________________________ 3:34 PM on 01/10/2017 -----------------------------------------  CT pending. Care transferred to Dr. Lamont Snowballifenbark    Nita Sicklearolina Hersh Minney, MD 01/10/17 1534

## 2017-01-11 DIAGNOSIS — F603 Borderline personality disorder: Secondary | ICD-10-CM | POA: Diagnosis not present

## 2017-01-11 NOTE — ED Notes (Signed)
Patient is alert and oriented, no behavioral issues, is calm, still remains with Si, but without a plan, camera monitoring in progress, and q 15 minute checks.

## 2017-01-11 NOTE — ED Notes (Signed)
Pt is alert and oriented this evening. Pt mood is sad/anxious but he is pleasant and cooperative with staff. Pt does endorse passive SI at this time but also contracts for safety. Writer discussed tx plan, food and drink provided and 15 minute checks are ongoing for safety.

## 2017-01-11 NOTE — ED Notes (Signed)
PT  IVC  PENDING  PLACEMENT/  IVC  PAPERS TO  BE RENEWED  ON 01/12/17

## 2017-01-11 NOTE — ED Provider Notes (Signed)
-----------------------------------------   8:00 AM on 01/11/2017 -----------------------------------------   Blood pressure 129/69, pulse 78, temperature 97.4 F (36.3 C), temperature source Oral, resp. rate 18, height 5\' 10"  (1.778 m), weight 230 lb (104.3 kg), SpO2 100 %.  The patient had no acute events since last update.  Calm and cooperative at this time.  Disposition is pending Psychiatry/Behavioral Medicine team recommendations.     Willy EddyPatrick Halyn Flaugher, MD 01/11/17 0800

## 2017-01-11 NOTE — ED Notes (Addendum)
Counselor has contacted the pts previous [facility] [phone]([name]) to confirm that the pt had not been provided with the proper 30 day discharge request. She shared that the request was not submitted as she assumes that the pt will be taken into police custody at the time of discharge.
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
## 2017-01-11 NOTE — ED Notes (Signed)
Patient is alert and oriented, watching tv, no behavioral issues, Patient is safe, q 15 minute checks and camera surveillance in progress.
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
## 2017-01-11 NOTE — ED Notes (Signed)
Patient did eat 100% of lunch and had beverage, Patient did not eat breakfast, patient denies pain, no behavioral issues noted. Patient states that he still has SI without a plan here. q 15 minute checks and camera monitoring in progress.
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
## 2017-01-11 NOTE — ED Notes (Signed)
Patient is sleeping, snoring, no signs of distress, no behavioral issues, camera surveillance in progress and q 15 minute checks.

## 2017-01-11 NOTE — ED Notes (Signed)
PT IVC PENDING PLACEMENT/ IVC PAPERS RENEWAL ON 01/12/17

## 2017-01-11 NOTE — ED Notes (Signed)
Patient is sleeping, no signs of distress, Patient's breakfast tray given to him, but he just said " naw" Patient encouraged to call for assist if anything needed or any concerns.

## 2017-01-11 NOTE — Clinical Social Work Note (Addendum)
CSW contaced patient's [name] [phone] who is the legal guardian who said patient has not lived with them since he was 21 years old he has always been in group homes or psychiatric hospital.  CSW was informed by patient's mother, that he has a case manager from [employer] Kyle Wang [phone] who helped find placement for him last time.  CSW was also informed by patient's mother that his case manager was looking to try to get services from [name].  CSW attempted to contact [employer] case manager and left a message.    CSW was informed by assistant director Kyle Wang  to find out if group home had placed charges for patient before trying to look for placement for patient.  CSW contacted patient's group home to find out if they pressed charges.  CSW spoke to [place] [phone] at [facility] and she said they did [place] [facility] [facility] [place] according to her mom Ms Kyle Wang who is the group home owner.  3:30pm  CSW received phone call back from [facility] case manager Kyle Wang [phone] who said last time patient needed group home placement [facility] CSW assisted him with finding placement.  CSW informed case manager that the group home reported that charges have been placed for patient, CSW is waiting for confirmation that patient has charges placed.   4:15pm  CSW spoke to CSW assistant director Kyle Wang, regarding possible charges that were pressed on patient from group home, assistant director spoke to security who will investigate if charges have been pressed so they can coordinate with police department.  5:30pm  CSW received phone call back from Chiropodistassistant director who spoke to security, there is not evidence charges being pressed at this time which means the patient will have to return unless charges are pressed.  CSW contacted Kyle Wang at group home and she said to contact her mom Mrs.  Connye Wang which is the owner of the home to find out when the charges were being pressed.  CSW attempted to contact her at [phone] twice and there was no answer and no voice mail.  CSW will attempt to contact again tomorrow.  Kyle Wang, MSW, Theresia MajorsLCSWA [phone]  01/11/2017 2:23 PM

## 2017-01-11 NOTE — ED Notes (Signed)
Counselor contacted the social worker assigned to cover the ER on today Minerva Areola(Eric @ [phone]). Writer has requested an update and attempted to provided report for this particular pt as he is psychiatrically clear and no longer in need of psychiatric monitoring/assessment. Counselor was told that this task would not be completed and counselor was  unable to [facility]. Minerva Areolaric has stated " I am not going to do it, My supervisor has told me not to do it." Counselor  will not be capable of providing a pasar or FL2 form  if the pt so happens to need one. Writer has suggested that [employer] leaders[employer] discuss options to determine how to better serve the client.  Writer provided the contact information for the appropriated administrator, Massie BougieBelinda [phone]   Mrs. Raynelle FanningBelinda Booker has been updated and is awaiting a call from [facility] supervisor.

## 2017-01-11 NOTE — ED Notes (Signed)
Patient is taking a shower.

## 2017-01-11 NOTE — ED Notes (Signed)
Patient is sleeping , no evidence of distress, patient states that He was bored and just wanted to go to sleep, patient with q 15  Minute checks and camera surveillance in progress.

## 2017-01-12 NOTE — ED Notes (Signed)
Patient is had dinner tray and ate 100% of supper and had beverage.
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
## 2017-01-12 NOTE — ED Notes (Signed)
Patient is alert and oriented, sitting in dayroom with another Patient, they are talking calmly, No signs of distress, no behavioral issues, q 15 minute checks and camera surveillance.

## 2017-01-12 NOTE — ED Notes (Signed)
Patient is taking shower, no behavioral issues.

## 2017-01-12 NOTE — ED Provider Notes (Signed)
-----------------------------------------   5:12 AM on 01/12/2017 -----------------------------------------   Blood pressure 129/69, pulse 78, temperature 97.4 F (36.3 C), temperature source Oral, resp. rate 18, height 5\' 10"  (1.778 m), weight 104.3 kg, SpO2 100 %.  The patient had no acute events since last update.  Calm and cooperative at this time.  No indication for IVC or inpatient admission per Dr. Toni Amendlapacs' note.  Also reportedly no outstanding charges/warrants against him.  Last social work note discussed the patient going back to group home.    Loleta Roseory Cyann Venti, MD 01/12/17 [phone]
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
## 2017-01-12 NOTE — ED Notes (Signed)
Patient received lunch tray and ate 100% of lunch.

## 2017-01-12 NOTE — ED Notes (Signed)
Patient is up in dayroom watching tv, no signs of distress, states that he still feels SI, but no plan at this time, patient with q 15 minute checks and camera surveillance in progress.

## 2017-01-13 NOTE — Clinical Social Work Note (Addendum)
CSW received phone call from group home owner Reina Fusethel Daniels who said she spoke to the sheriff's department today and Officer Zonia KiefStephens [phone] informed her that the warrant will be issued around 5:30pm this evening.  CSW contacted security to update them of the situation.  Ed social work to continue to follow patient's progress.  Ervin KnackEric R. Kadarrius Yanke, MSW, Theresia MajorsLCSWA [phone]  01/13/2017 9:52 AM

## 2017-01-13 NOTE — ED Notes (Signed)
Patient c/o mild anxiety and "boredom". Requested and received  Prn anti anxiety medication.

## 2017-01-13 NOTE — ED Provider Notes (Signed)
-----------------------------------------   6:21 AM on 01/13/2017 -----------------------------------------   Blood pressure 133/77, pulse 68, temperature 98.5 F (36.9 C), temperature source Oral, resp. rate 18, height 5\' 10"  (1.778 m), weight 230 lb (104.3 kg), SpO2 97 %.  The patient had no acute events since last update.  Calm and cooperative at this time.  Disposition is pending Psychiatry/Behavioral Medicine team recommendations.     Jeanmarie PlantJames A Lui Bellis, MD 01/13/17 [phone]

## 2017-01-13 NOTE — ED Notes (Signed)
Pt is alert and oriented this evening. Pt mood and affect are appropriate at this time, although he does endorse passive SI. Pt also contracts for safety and is taking medications as prescribed. 15 minute checks are ongoing for safety.
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
## 2017-01-13 NOTE — ED Provider Notes (Signed)
Psychiatry note reviewed. Update from behavioral health nurses that law enforcement has also had warrants on the patient and plans to pick him up today to take him to jail.  Per previous evaluations here in the ED and recent psychiatry consult note, patient is medically and psychiatrically stable. We'll discharge to custody of law enforcement as the best available safety plan at this time. Appropriate residential arrangements for the patient have been unattainable so far.   Sharman CheekPhillip Zayed Griffie, MD 01/13/17 [phone]

## 2017-01-13 NOTE — ED Notes (Signed)
Pt is alert and calm this evening watching TV in dayroom. Somewhat anxious but pleasant and cooperative with staff. Pt denies SI at this time. Food and drink provided. 15 minute checks ongoing for safety.

## 2017-01-13 NOTE — ED Notes (Signed)
Report to EDP legal plan for pt.
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
## 2017-01-14 NOTE — ED Notes (Signed)
Patient asleep in room. No noted distress or abnormal behavior. Will continue 15 minute checks and observation by security cameras for safety. 

## 2017-01-14 NOTE — ED Provider Notes (Signed)
Patient has been cleared to return to police custody.   Emily FilbertJonathan E Williams, MD 01/14/17 2024

## 2017-01-14 NOTE — Progress Notes (Signed)
LCSW recalled patients Group Home [phone] and left another detailed message for  Mrs Garner NashDaniels. Awaiting call back. On last message I did state if I don't hear back I will have to contact APS.  Called [facility] for clarification of patient status as our security guard reports he is not attached or involved with them and will not call them about warrants.  Called [facility] spoke to Lockheed MartinDeputy Jordon for clarification and there is NO WARRANT for this patients arrest.    Called Guardian- She has not received written 30 day notice but did insist that his personal effects  Needed to be picked up on Monday, his guardian complied. LCSW explained that APS would now be called.  Delta Air LinesClaudine Markeese Boyajian LCSW [phone]

## 2017-01-14 NOTE — ED Notes (Signed)
Patient awake, sitting in common area. Maintained on 15 minute checks and observation by security camera for safety.

## 2017-01-14 NOTE — ED Notes (Signed)
Patient met with social worker regarding disposition.

## 2017-01-14 NOTE — ED Notes (Addendum)
Preparing patient for discharge.  Sheriffs are here to transport patient to jail for outstanding warrants.

## 2017-01-14 NOTE — ED Notes (Signed)
Patient received meal tray and beverage. Maintained on 15 minute checks and observation by security camera for safety. 

## 2017-01-14 NOTE — ED Notes (Addendum)
Patient denies suicidal ideation, homicidal ideation, and audio visual hallucinations.

## 2017-01-14 NOTE — ED Notes (Signed)
Maintained on 15 minute checks and observation by security camera for safety. 

## 2017-01-14 NOTE — ED Notes (Signed)
Patient resting quietly in room. No noted distress or abnormal behaviors noted. Will continue 15 minute checks and observation by security camera for safety. 
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
## 2017-01-14 NOTE — ED Provider Notes (Signed)
-----------------------------------------   4:13 AM on 01/14/2017 -----------------------------------------   Blood pressure 126/74, pulse 72, temperature 98 F (36.7 C), temperature source Axillary, resp. rate 19, height 5\' 10"  (1.778 m), weight 230 lb (104.3 kg), SpO2 100 %.  The patient had no acute events since last update.  Awaiting discharge into police custody.   Minna AntisKevin Tomesha Sargent, MD 01/14/17 [phone]

## 2017-01-14 NOTE — Progress Notes (Signed)
LCSW called and left voice message for E Garner Nashaniels Group Provider  Consulted Celene SkeenZack Brooks Assistant Director and explained this patients current situation ( DC from ED) and he stated that we are to await the [facility] to provide appropriate documentation ( warrant) and for our security guards to support transfer of the patient.  When I consulted our security guard he reports he has nothing to do with [facility] and wont be phoning anyone.  Delta Air LinesClaudine Isaiyah Feldhaus LCSW [phone]

## 2017-01-14 NOTE — ED Notes (Signed)
Patient awakened for morning medications. Maintained on 15 minute checks and observation by security camera for safety.

## 2017-01-14 NOTE — ED Notes (Signed)
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
## 2017-01-14 NOTE — Progress Notes (Signed)
LCSW called and left message for [facility] [facility] [phone]>patient was not picked up.  Awaiting a call back   Marquis Diles St. AnsgarBandi LCSW [phone]

## 2017-01-14 NOTE — ED Notes (Signed)
Patient received meal tray and beverage. 

## 2017-06-07 ENCOUNTER — Emergency Department (HOSPITAL_COMMUNITY): Payer: Medicaid Other

## 2017-06-07 ENCOUNTER — Encounter (HOSPITAL_COMMUNITY): Payer: Self-pay | Admitting: Emergency Medicine

## 2017-06-07 ENCOUNTER — Emergency Department (HOSPITAL_COMMUNITY)
Admission: EM | Admit: 2017-06-07 | Discharge: 2017-06-07 | Disposition: A | Discharge status: Home / Self Care | Payer: Medicaid Other | Attending: Emergency Medicine | Admitting: Emergency Medicine

## 2017-06-07 DIAGNOSIS — Y9301 Activity, walking, marching and hiking: Secondary | ICD-10-CM | POA: Insufficient documentation

## 2017-06-07 DIAGNOSIS — Y92488 Other paved roadways as the place of occurrence of the external cause: Secondary | ICD-10-CM | POA: Insufficient documentation

## 2017-06-07 DIAGNOSIS — S93401A Sprain of unspecified ligament of right ankle, initial encounter: Secondary | ICD-10-CM | POA: Diagnosis not present

## 2017-06-07 DIAGNOSIS — J45909 Unspecified asthma, uncomplicated: Secondary | ICD-10-CM | POA: Insufficient documentation

## 2017-06-07 DIAGNOSIS — Y999 Unspecified external cause status: Secondary | ICD-10-CM | POA: Insufficient documentation

## 2017-06-07 DIAGNOSIS — W101XXA Fall (on)(from) sidewalk curb, initial encounter: Secondary | ICD-10-CM | POA: Diagnosis not present

## 2017-06-07 DIAGNOSIS — Z79899 Other long term (current) drug therapy: Secondary | ICD-10-CM | POA: Insufficient documentation

## 2017-06-07 DIAGNOSIS — S99911A Unspecified injury of right ankle, initial encounter: Secondary | ICD-10-CM | POA: Diagnosis present

## 2017-06-07 MED ORDER — NAPROXEN 500 MG PO TABS: 500.0000 mg | ORAL_TABLET | Freq: Two times a day (BID) | ORAL | 0 refills | Status: DC

## 2017-06-07 NOTE — ED Triage Notes (Signed)
Per [facility] pt had fall , right ankle pain, previous injury to same extremity. Per [facility] pedal pulse intact yet painful ROM. No dizziness prior to fall.  BP 136/90, HR 72, RR 16 . Alert and oriented x 4. Pain 6/10

## 2017-06-07 NOTE — Discharge Instructions (Signed)
Ice and elevate your ankle at home. Your xray is normal. Take naprosyn for pain and inflammation. Crutches for 1-2 days as needed. Follow up with orthopedics

## 2017-06-07 NOTE — Progress Notes (Signed)
Orthopedic Tech Progress Note Patient Details:  Gus PumaBrandon S Becknell 1996/10/17 [id number]  Ortho Devices Type of Ortho Device: ASO, Crutches Ortho Device/Splint Interventions: Application   Saul FordyceJennifer C Romonda Parker 06/07/2017, 3:34 PM
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
## 2017-06-17 ENCOUNTER — Emergency Department: Payer: Medicaid Other

## 2017-06-17 DIAGNOSIS — S83422A Sprain of lateral collateral ligament of left knee, initial encounter: Secondary | ICD-10-CM | POA: Insufficient documentation

## 2017-06-17 DIAGNOSIS — Y939 Activity, unspecified: Secondary | ICD-10-CM | POA: Diagnosis not present

## 2017-06-17 DIAGNOSIS — Y999 Unspecified external cause status: Secondary | ICD-10-CM | POA: Insufficient documentation

## 2017-06-17 DIAGNOSIS — Z87891 Personal history of nicotine dependence: Secondary | ICD-10-CM | POA: Insufficient documentation

## 2017-06-17 DIAGNOSIS — Y929 Unspecified place or not applicable: Secondary | ICD-10-CM | POA: Insufficient documentation

## 2017-06-17 DIAGNOSIS — Z79899 Other long term (current) drug therapy: Secondary | ICD-10-CM | POA: Insufficient documentation

## 2017-06-17 DIAGNOSIS — M25562 Pain in left knee: Secondary | ICD-10-CM | POA: Diagnosis present

## 2017-06-17 DIAGNOSIS — W541XXA Struck by dog, initial encounter: Secondary | ICD-10-CM | POA: Insufficient documentation

## 2017-06-17 DIAGNOSIS — J45909 Unspecified asthma, uncomplicated: Secondary | ICD-10-CM | POA: Insufficient documentation

## 2017-06-17 NOTE — ED Notes (Signed)
Patient transported to X-ray 

## 2017-06-17 NOTE — ED Triage Notes (Signed)
Patient states that he was playing with a 175 lb dog and the dog ran into his left knee. Patient states that he felt his knee pop. Patient states that he has had previous knee injuries.

## 2017-06-18 ENCOUNTER — Emergency Department
Admission: EM | Admit: 2017-06-18 | Discharge: 2017-06-18 | Disposition: A | Discharge status: Home / Self Care | Payer: Medicaid Other | Attending: Emergency Medicine | Admitting: Emergency Medicine

## 2017-06-18 DIAGNOSIS — M25562 Pain in left knee: Secondary | ICD-10-CM

## 2017-06-18 DIAGNOSIS — S83412A Sprain of medial collateral ligament of left knee, initial encounter: Secondary | ICD-10-CM

## 2017-06-18 MED ORDER — IBUPROFEN 800 MG PO TABS
800.0000 mg | ORAL_TABLET | Freq: Once | ORAL | Status: AC
  Administered 2017-06-18: 800 mg via ORAL
  Filled 2017-06-18: qty 1

## 2017-06-18 MED ORDER — HYDROCODONE-ACETAMINOPHEN 5-325 MG PO TABS
1.0000 | ORAL_TABLET | ORAL | Status: AC
  Administered 2017-06-18: 1 via ORAL
  Filled 2017-06-18: qty 1

## 2017-06-18 MED ORDER — HYDROCODONE-ACETAMINOPHEN 5-325 MG PO TABS: 1.0000 | ORAL_TABLET | Freq: Four times a day (QID) | ORAL | 0 refills | Status: DC | PRN

## 2017-06-18 MED ORDER — IBUPROFEN 800 MG PO TABS: 800.0000 mg | ORAL_TABLET | Freq: Three times a day (TID) | ORAL | 0 refills | Status: DC | PRN

## 2017-06-18 NOTE — Discharge Instructions (Signed)
Please rest ice and elevate the knee. Use knee immobilizer as needed for pain when ambulating. Use crutches to help with ambulation. Return to the ER for any increasing pain, worsening symptoms urgent changes in her health. Follow-up with orthopedist in 2-3 days if no improvement.

## 2017-06-18 NOTE — ED Notes (Signed)
Reports numbness 4th and 5th toes and lateral side of left foot up to knee.  Numbness has started just since being the waiting room.  Was not numb prior, only pain.

## 2017-06-18 NOTE — ED Provider Notes (Signed)
[facility]-EMERGENCY DEPARTMENT Provider Note   CSN: [id number] Arrival date & time: 06/17/17  2141     History   Chief Complaint Chief Complaint  Patient presents with  . Knee Pain    HPI Kyle Wang is a 21 y.o. male resents to the emergency department for evaluation of left knee pain. Patient states earlier today his dog ran into the medial aspect of his left knee and he felt a pop. Patient's pain is moderate 8 out of 10 he has a history of prior anterior cruciate ligament sprain that was treated nonoperatively with physical therapy. Patient was doing well until the night. He feels as if his legs giving away. He denies any hip or ankle pain. He is ambulatory with crutches. Denies any other injury to his body.  HPI  Past Medical History:  Diagnosis Date  . Asthma   . Bipolar 1 disorder (HCC)   . Suicide [facility](HCC)     Patient Active Problem List   Diagnosis Date Noted  . Self-inflicted laceration of wrist 07/12/2016  . Borderline personality disorder 07/12/2016  . GERD (gastroesophageal reflux disease) 06/02/2016  . Tobacco use disorder 06/02/2016  . Asthma 06/02/2016  . Suicidal ideation 06/01/2016  . Personality disorder 06/01/2016  . Bipolar disorder (HCC) 06/01/2016    Past Surgical History:  Procedure Laterality Date  . CHOLECYSTECTOMY         Home Medications    Prior to Admission medications   Medication Sig Start Date End Date Taking? Authorizing Provider  divalproex (DEPAKOTE) 500 MG DR tablet Take 1 tablet (500 mg total) by mouth every 12 (twelve) hours. 08/08/16  Yes Clapacs, Jackquline DenmarkJohn T, MD  escitalopram (LEXAPRO) 20 MG tablet Take 1 tablet (20 mg total) by mouth daily. Patient taking differently: Take 40 mg by mouth daily.  08/09/16  Yes Clapacs, Jackquline DenmarkJohn T, MD  OLANZapine (ZYPREXA) 20 MG tablet Take 1 tablet (20 mg total) by mouth at bedtime. Patient taking differently: Take 20 mg by mouth every morning.  08/08/16  Yes Clapacs, Jackquline DenmarkJohn T, MD  clonazePAM  (KLONOPIN) 0.5 MG tablet Take 1 tablet (0.5 mg total) by mouth 3 (three) times daily. Patient not taking: Reported on 01/06/2017 07/12/16   Clapacs, Jackquline DenmarkJohn T, MD  diphenhydrAMINE (BENADRYL) 50 MG capsule Take 1 capsule (50 mg total) by mouth at bedtime. Patient not taking: Reported on 01/06/2017 08/08/16   Clapacs, Jackquline DenmarkJohn T, MD  fluticasone [facility](FLONASE) 50 MCG/ACT nasal spray Place 2 sprays into both nostrils daily. Patient not taking: Reported on 06/07/2017 12/02/16   Hagler, Jami L, PA-C  HYDROcodone-acetaminophen (NORCO) 5-325 MG tablet Take 1 tablet by mouth every 6 (six) hours as needed for moderate pain. 06/18/17   Evon SlackGaines, Tura Roller C, PA-C  ibuprofen (ADVIL,MOTRIN) 800 MG tablet Take 1 tablet (800 mg total) by mouth every 8 (eight) hours as needed. 06/18/17   Evon SlackGaines, Brandan Robicheaux C, PA-C  OLANZapine (ZYPREXA) 5 MG tablet Take 1 tablet (5 mg total) by mouth daily. 08/09/16   Clapacs, Jackquline DenmarkJohn T, MD  ondansetron (ZOFRAN ODT) 4 MG disintegrating tablet Take 1 tablet (4 mg total) by mouth every 8 (eight) hours as needed for nausea or vomiting. Patient not taking: Reported on 01/06/2017 10/18/16   Nita SickleVeronese, , MD  predniSONE (DELTASONE) 5 MG tablet Take 6 tablets (30 mg total) by mouth daily with breakfast. May take for up to 5 days.  Do not take any NSAIDs with this medication. Take with food. Patient not taking: Reported on 01/06/2017 12/02/16   Hagler,  Jami L, PA-C  promethazine-dextromethorphan (PROMETHAZINE-DM) 6.25-15 MG/5ML syrup Take 5 mLs by mouth 4 (four) times daily as needed for cough. Patient not taking: Reported on 01/06/2017 12/02/16   Hagler, Jami L, PA-C  ranitidine (ZANTAC) 150 MG tablet Take 1 tablet (150 mg total) by mouth 2 (two) times daily. Patient not taking: Reported on 06/07/2017 09/17/16 09/17/17  Phineas Semen, MD  sucralfate (CARAFATE) 1 g tablet Take 1 tablet (1 g total) by mouth 4 (four) times daily. Patient not taking: Reported on 01/06/2017 09/17/16   Phineas Semen, MD  traMADol  (ULTRAM) 50 MG tablet Take 1 tablet (50 mg total) by mouth every 6 (six) hours as needed for moderate pain. Patient not taking: Reported on 01/06/2017 08/11/16   Joni Reining, PA-C    Family History Family History  Problem Relation Age of Onset  . Adopted: Yes    Social History Social History  Substance Use Topics  . Smoking status: Former Smoker    Packs/day: 2.00    Types: Cigarettes    Quit date: 08/18/2013  . Smokeless tobacco: Current User  . Alcohol use No     Allergies   Patient has no known allergies.   Review of Systems Review of Systems  Respiratory: Negative for shortness of breath.   Cardiovascular: Negative for chest pain.  Musculoskeletal: Positive for arthralgias, gait problem and joint swelling. Negative for myalgias, neck pain and neck stiffness.  Skin: Negative for rash and wound.  Neurological: Negative for numbness and headaches.     Physical Exam Updated Vital Signs BP 133/81 (BP Location: Left Arm)   Pulse 71   Temp 99 F (37.2 C) (Oral)   Resp 20   Ht 5\' 9"  (1.753 m)   Wt 96.6 kg (213 lb)   SpO2 97%   BMI 31.45 kg/m   Physical Exam  Constitutional: He is oriented to person, place, and time. He appears well-developed and well-nourished.  HENT:  Head: Normocephalic and atraumatic.  Right Ear: External ear normal.  Left Ear: External ear normal.  Eyes: Conjunctivae are normal.  Neck: Normal range of motion.  Cardiovascular: Normal rate and regular rhythm.   Pulmonary/Chest: Effort normal and breath sounds normal.  Abdominal: Soft. Bowel sounds are normal.  Musculoskeletal:  Examination of the left knee shows patient is able to straight leg raise. There is mild swelling with no warmth or erythema. He has 0-90 range of motion with pain with flexion. He has slight laxity with anterior drawer test. Pain with stressing medial collateral ligament and no laxity with MCL stress testing. Patient has no lateral knee pain or instability. There is  no skin breakdown noted. He is nontender throughout the hip or ankle and has no pain with log rolling.  Neurological: He is alert and oriented to person, place, and time.  Skin: Skin is warm. No erythema.  Psychiatric: He has a normal mood and affect. His behavior is normal. Thought content normal.     ED Treatments / Results  Labs (all labs ordered are listed, but only abnormal results are displayed) Labs Reviewed - No data to display  EKG  EKG Interpretation None       Radiology Dg Knee Complete 4 Views Left  Result Date: 06/17/2017 CLINICAL DATA:  Blunt trauma while playing with his dog. EXAM: LEFT KNEE - COMPLETE 4+ VIEW COMPARISON:  None. FINDINGS: No evidence of fracture, dislocation, or joint effusion. No evidence of arthropathy or other focal bone abnormality. Soft tissues are unremarkable. IMPRESSION:  Negative. Electronically Signed   By: Ellery Plunkaniel R Mitchell M.D.   On: 06/17/2017 22:18    Procedures Procedures (including critical care time) SPLINT APPLICATION Date/Time: 1:03 AM Authorized by: Patience MuscaGAINES, Katrece Roediger CHRISTOPHER Consent: Verbal consent obtained. Risks and benefits: risks, benefits and alternatives were discussed Consent given by: patient [facility] details[facility] Left knee  Splint type knee immobilizer  Supplies used: Knee immobilizer  Post-procedure: The splinted body part was neurovascularly unchanged following the procedure. Patient tolerance: Patient tolerated the procedure well with no immediate complications.     Medications Ordered in ED Medications  HYDROcodone-acetaminophen (NORCO/VICODIN) 5-325 MG per tablet 1 tablet (not administered)  ibuprofen (ADVIL,MOTRIN) tablet 800 mg (not administered)     Initial Impression / Assessment and Plan / ED Course  I have reviewed the triage vital signs and the nursing notes.  Pertinent labs & imaging results that were available during my care of the patient were reviewed by me and  considered in my medical decision making (see chart for details).     21 year old male with left knee sprain.X-rays negative for acute bony abnormality. Tender along the MCL with pain with MCL stress testing. No significant laxity of the MCL. Patient with mild noticeable anterior cruciate ligament laxity on anterior drawer test. History of anterior cruciate ligament injury in the past. Patient is placed into a knee immobilizer. He will follow-up with orthopedics. He will take ibuprofen 800 mg as needed for pain. He is given a prescription for Norco, #5 tablets to take as needed for severe pain only.  Final Clinical Impressions(s) / ED Diagnoses   Final diagnoses:  Sprain of medial collateral ligament of left knee, initial encounter  Acute pain of left knee    New Prescriptions New Prescriptions   HYDROCODONE-ACETAMINOPHEN (NORCO) 5-325 MG TABLET    Take 1 tablet by mouth every 6 (six) hours as needed for moderate pain.   IBUPROFEN (ADVIL,MOTRIN) 800 MG TABLET    Take 1 tablet (800 mg total) by mouth every 8 (eight) hours as needed.     Evon SlackGaines, Johnothan Bascomb C, PA-C 06/18/17 0104    Evon SlackGaines, Poseidon Pam C, PA-C 06/18/17 0105    Emily FilbertWilliams, Jonathan E, MD 06/18/17 [phone]

## 2017-06-29 ENCOUNTER — Emergency Department
Admission: EM | Admit: 2017-06-29 | Discharge: 2017-06-29 | Disposition: A | Discharge status: Home / Self Care | Payer: Medicaid Other | Attending: Emergency Medicine | Admitting: Emergency Medicine

## 2017-06-29 ENCOUNTER — Encounter: Payer: Self-pay | Admitting: *Deleted

## 2017-06-29 DIAGNOSIS — Z87891 Personal history of nicotine dependence: Secondary | ICD-10-CM | POA: Diagnosis not present

## 2017-06-29 DIAGNOSIS — Z79899 Other long term (current) drug therapy: Secondary | ICD-10-CM | POA: Insufficient documentation

## 2017-06-29 DIAGNOSIS — F319 Bipolar disorder, unspecified: Secondary | ICD-10-CM | POA: Insufficient documentation

## 2017-06-29 DIAGNOSIS — J45909 Unspecified asthma, uncomplicated: Secondary | ICD-10-CM | POA: Insufficient documentation

## 2017-06-29 DIAGNOSIS — Z760 Encounter for issue of repeat prescription: Secondary | ICD-10-CM | POA: Insufficient documentation

## 2017-06-29 MED ORDER — OLANZAPINE 10 MG PO TABS: 20.0000 mg | ORAL_TABLET | Freq: Every day | ORAL | 0 refills | Status: DC

## 2017-06-29 MED ORDER — OLANZAPINE 5 MG PO TABS: 5.0000 mg | ORAL_TABLET | Freq: Every day | ORAL | 0 refills | Status: DC

## 2017-06-29 NOTE — ED Triage Notes (Addendum)
Pt out of zyprexa for 6 days.  Pt has appt to see doctor on Monday at [facility].  Pt denies SI or HI.  Pt alert.  Pt calm and cooperative.  Pt lives in a group home.

## 2017-06-29 NOTE — ED Notes (Signed)
Pt from [facility], contact info [phone], rep leaving at this time. Pt prescription ran out of olanzapine

## 2017-06-29 NOTE — ED Provider Notes (Signed)
[facility] Emergency Department Provider Note   ____________________________________________   I have reviewed the triage vital signs and the nursing notes.   HISTORY  Chief Complaint Medication Refill    HPI Kyle Wang is a 21 y.o. male persistent emergency department requesting medication refill. Patient reports he is followed by [facility] for his olanzapine prescription. Patient reports missing a previous appointment causing his olanzapine prescription to not be filled. Patient has a schedule appointment for Monday. Patient offers no other complaints. Patient denies fever, chills, headache, vision changes, chest pain, chest tightness, shortness of breath, abdominal pain, nausea and vomiting.  Past Medical History:  Diagnosis Date  . Asthma   . Bipolar 1 disorder (HCC)   . Suicide [facility](HCC)     Patient Active Problem List   Diagnosis Date Noted  . Self-inflicted laceration of wrist 07/12/2016  . Borderline personality disorder 07/12/2016  . GERD (gastroesophageal reflux disease) 06/02/2016  . Tobacco use disorder 06/02/2016  . Asthma 06/02/2016  . Suicidal ideation 06/01/2016  . Personality disorder 06/01/2016  . Bipolar disorder (HCC) 06/01/2016    Past Surgical History:  Procedure Laterality Date  . CHOLECYSTECTOMY      Prior to Admission medications   Medication Sig Start Date End Date Taking? Authorizing Provider  citalopram (CELEXA) 40 MG tablet Take 40 mg by mouth daily.   Yes [provider]  divalproex (DEPAKOTE) 500 MG DR tablet Take 1 tablet (500 mg total) by mouth every 12 (twelve) hours. 08/08/16  Yes Clapacs, Jackquline DenmarkJohn T, MD  OLANZapine (ZYPREXA) 5 MG tablet Take 1 tablet (5 mg total) by mouth daily. 08/09/16  Yes Clapacs, Jackquline DenmarkJohn T, MD  clonazePAM (KLONOPIN) 0.5 MG tablet Take 1 tablet (0.5 mg total) by mouth 3 (three) times daily. Patient not taking: Reported on 01/06/2017 07/12/16   Clapacs, Jackquline DenmarkJohn T, MD  diphenhydrAMINE  (BENADRYL) 50 MG capsule Take 1 capsule (50 mg total) by mouth at bedtime. Patient not taking: Reported on 01/06/2017 08/08/16   Clapacs, Jackquline DenmarkJohn T, MD  escitalopram (LEXAPRO) 20 MG tablet Take 1 tablet (20 mg total) by mouth daily. Patient taking differently: Take 40 mg by mouth daily.  08/09/16   Clapacs, Jackquline DenmarkJohn T, MD  fluticasone (FLONASE) 50 MCG/ACT nasal spray Place 2 sprays into both nostrils daily. Patient not taking: Reported on 06/07/2017 12/02/16   Hagler, Jami L, PA-C  HYDROcodone-acetaminophen (NORCO) 5-325 MG tablet Take 1 tablet by mouth every 6 (six) hours as needed for moderate pain. 06/18/17   Evon SlackGaines, Thomas C, PA-C  ibuprofen (ADVIL,MOTRIN) 800 MG tablet Take 1 tablet (800 mg total) by mouth every 8 (eight) hours as needed. 06/18/17   Evon SlackGaines, Thomas C, PA-C  OLANZapine (ZYPREXA) 10 MG tablet Take 2 tablets (20 mg total) by mouth at bedtime. 06/29/17   Little, Traci M, PA-C  OLANZapine (ZYPREXA) 20 MG tablet Take 1 tablet (20 mg total) by mouth at bedtime. Patient taking differently: Take 20 mg by mouth every morning.  08/08/16   Clapacs, Jackquline DenmarkJohn T, MD  OLANZapine (ZYPREXA) 5 MG tablet Take 1 tablet (5 mg total) by mouth daily. 06/29/17 06/29/18  Little, Traci M, PA-C  ondansetron (ZOFRAN ODT) 4 MG disintegrating tablet Take 1 tablet (4 mg total) by mouth every 8 (eight) hours as needed for nausea or vomiting. Patient not taking: Reported on 01/06/2017 10/18/16   Nita SickleVeronese, [place], MD  predniSONE (DELTASONE) 5 MG tablet Take 6 tablets (30 mg total) by mouth daily with breakfast. May take for up to  5 days.  Do not take any NSAIDs with this medication. Take with food. Patient not taking: Reported on 01/06/2017 12/02/16   Hagler, Jami L, PA-C  promethazine-dextromethorphan (PROMETHAZINE-DM) 6.25-15 MG/5ML syrup Take 5 mLs by mouth 4 (four) times daily as needed for cough. Patient not taking: Reported on 01/06/2017 12/02/16   Hagler, Jami L, PA-C  ranitidine (ZANTAC) 150 MG tablet Take 1 tablet (150 mg total)  by mouth 2 (two) times daily. Patient not taking: Reported on 06/07/2017 09/17/16 09/17/17  Phineas Semen, MD  sucralfate (CARAFATE) 1 g tablet Take 1 tablet (1 g total) by mouth 4 (four) times daily. Patient not taking: Reported on 01/06/2017 09/17/16   Phineas Semen, MD  traMADol (ULTRAM) 50 MG tablet Take 1 tablet (50 mg total) by mouth every 6 (six) hours as needed for moderate pain. Patient not taking: Reported on 01/06/2017 08/11/16   Joni Reining, PA-C    Allergies Patient has no known allergies.  Family History  Problem Relation Age of Onset  . Adopted: Yes    Social History Social History  Substance Use Topics  . Smoking status: Former Smoker    Packs/day: 2.00    Types: Cigarettes    Quit date: 08/18/2013  . Smokeless tobacco: Current User  . Alcohol use No    Review of Systems Constitutional: Negative for fever/chills Eyes: No visual changes. ENT:  Negative for sore throat and for difficulty swallowing Cardiovascular: Denies chest pain. Respiratory: Denies cough. Denies shortness of breath. Gastrointestinal: No abdominal pain.  No nausea, vomiting, diarrhea. Genitourinary: Negative for dysuria. Musculoskeletal: Negative for back pain. Skin: Negative for rash. Neurological: Negative for headaches.  Negative focal weakness or numbness. Negative for loss of consciousness. Able to ambulate. ____________________________________________   PHYSICAL EXAM:  VITAL SIGNS: ED Triage Vitals  Enc Vitals Group     BP 06/29/17 1931 (!) 144/84     Pulse Rate 06/29/17 1931 73     Resp 06/29/17 1931 20     Temp 06/29/17 1931 98.8 F (37.1 C)     Temp Source 06/29/17 1931 Oral     SpO2 06/29/17 1931 98 %     Weight 06/29/17 1931 213 lb (96.6 kg)     Height 06/29/17 1931 5\' 9"  (1.753 m)     Head Circumference --      Peak Flow --      Pain Score 06/29/17 2145 0     Pain Loc --      Pain Edu? --      Excl. in GC? --     Constitutional: Alert and oriented. Well  appearing and in no acute distress.  Eyes: Conjunctivae are normal. PERRL. EOMI  Head: Normocephalic and atraumatic. ENT:      Ears: Canals clear. TMs intact bilaterally.      Nose: No congestion/rhinnorhea.      Mouth/Throat: Mucous membranes are moist.  Neck:Supple. No thyromegaly. No stridor.  Cardiovascular: Normal rate, regular rhythm. Normal S1 and S2.  Good peripheral circulation. Respiratory: Normal respiratory effort without tachypnea or retractions. Lungs CTAB. No wheezes/rales/rhonchi. Good air entry to the bases with no decreased or absent breath sounds. Hematological/Lymphatic/Immunological: No cervical lymphadenopathy. Cardiovascular: Normal rate, regular rhythm. Normal distal pulses. Gastrointestinal: Bowel sounds 4 quadrants. Soft and nontender to palpation. No guarding or rigidity. No palpable masses. No distention. No CVA tenderness. Musculoskeletal: Nontender with normal range of motion in all extremities. Neurologic: Normal speech and language. No gross focal neurologic deficits are appreciated. No gait instability.  Cranial nerves: II-X intact. No sensory loss or abnormal reflexes.  Skin:  Skin is warm, dry and intact. No rash noted. Psychiatric: Mood and affect are normal. Speech and behavior are normal. Patient exhibits appropriate insight and judgement.  ____________________________________________   LABS (all labs ordered are listed, but only abnormal results are displayed)  Labs Reviewed - No data to display ____________________________________________  EKG none ____________________________________________  RADIOLOGY None ____________________________________________   PROCEDURES  Procedure(s) performed: no    Critical Care performed: no ____________________________________________   INITIAL IMPRESSION / ASSESSMENT AND PLAN / ED COURSE  Pertinent labs & imaging results that were available during my care of the patient were reviewed by me and  considered in my medical decision making (see chart for details).  Patient presents to emergency department with request for medication refill. History, physical exam findings and vital signs were reassuring.  Patient will be prescribed 4 days of olanzapine until he follows up with his provider on Monday. Patient informed of clinical course, understand medical decision-making process, and agree with plan. Patient was advised to follow up with [facility] for follow up and future medication refills and was also advised to return to the emergency department for symptoms that change or worsen.     ____________________________________________   FINAL CLINICAL IMPRESSION(S) / ED DIAGNOSES  Final diagnoses:  Medication refill       NEW MEDICATIONS STARTED DURING THIS VISIT:  Discharge Medication List as of 06/29/2017  9:34 PM    START taking these medications   Details  !! OLANZapine (ZYPREXA) 10 MG tablet Take 2 tablets (20 mg total) by mouth at bedtime., Starting Thu 06/29/2017, Print    !! OLANZapine (ZYPREXA) 5 MG tablet Take 1 tablet (5 mg total) by mouth daily., Starting Thu 06/29/2017, Until Fri 06/29/2018, Print     !! - Potential duplicate medications found. Please discuss with provider.       Note:  This document was prepared using Dragon voice recognition software and may include unintentional dictation errors.    Clois Comber, PA-C 06/30/17 0033    Sharman Cheek, MD 07/03/17 2329

## 2017-06-29 NOTE — Discharge Instructions (Signed)
Attached are your prescriptions for Zyprexa covering through Monday until you can follow-up with your provider that regularly prescribes Zyprexa. If you notice any symptoms worsening and has a page return to emergency department.

## 2017-07-14 ENCOUNTER — Ambulatory Visit (HOSPITAL_COMMUNITY)
Admission: EM | Admit: 2017-07-14 | Discharge: 2017-07-14 | Disposition: A | Discharge status: Home / Self Care | Payer: Medicaid Other | Attending: Family | Admitting: Family

## 2017-07-14 ENCOUNTER — Encounter (HOSPITAL_COMMUNITY): Payer: Self-pay | Admitting: Emergency Medicine

## 2017-07-14 DIAGNOSIS — R110 Nausea: Secondary | ICD-10-CM

## 2017-07-14 DIAGNOSIS — R197 Diarrhea, unspecified: Secondary | ICD-10-CM

## 2017-07-14 DIAGNOSIS — R1013 Epigastric pain: Secondary | ICD-10-CM

## 2017-07-14 DIAGNOSIS — A048 Other specified bacterial intestinal infections: Secondary | ICD-10-CM

## 2017-07-14 LAB — POCT H PYLORI SCREEN: H. PYLORI SCREEN, POC: POSITIVE — AB

## 2017-07-14 MED ORDER — AMOXICILLIN 500 MG PO CAPS: 1000.0000 mg | ORAL_CAPSULE | Freq: Two times a day (BID) | ORAL | 0 refills | Status: AC

## 2017-07-14 MED ORDER — CLARITHROMYCIN 500 MG PO TABS: 500.0000 mg | ORAL_TABLET | Freq: Two times a day (BID) | ORAL | 0 refills | Status: AC

## 2017-07-14 MED ORDER — ONDANSETRON 4 MG PO TBDP: 4.0000 mg | ORAL_TABLET | Freq: Three times a day (TID) | ORAL | 0 refills | Status: DC | PRN

## 2017-07-14 MED ORDER — OMEPRAZOLE 20 MG PO CPDR: 20.0000 mg | DELAYED_RELEASE_CAPSULE | Freq: Two times a day (BID) | ORAL | 1 refills | Status: DC

## 2017-07-14 NOTE — ED Provider Notes (Signed)
[facility]    CSN: [id number] Arrival date & time: 07/14/17  1133     History   Chief Complaint Chief Complaint  Patient presents with  . Abdominal Pain    HPI Kyle Wang is a 21 y.o. male.   21 year old male accompanied by his mom with concern over increase in abdominal pain over the past 1-2 weeks. Pain occurs right after eating. Has been nauseous and developed diarrhea but no vomiting. Pain feels like a knife in his stomach and has pain and cramping for about 30-40 minutes after he eats. He had his gallbladder removed 2 years ago and continues to have soft/loose stools daily. He has not eaten anything in the past 24 hours so he is not experiencing pain currently but even non-fried, non-greasy foods cause symptoms now. He has tried PeptoBismul with minimal relief. Has history of occasional abdominal pain and has seen a GI specialist before in October and November 2017. He was placed on Zofran and Carafate as well as Zantac- does not remember if they helped or symptoms went away on own. Medical history includes bipolar disorder- he is on Depakote, Celexa and Zyprexa daily and lives in a group home in [place], [place].    The history is provided by the patient and a caregiver.    Past Medical History:  Diagnosis Date  . Asthma   . Bipolar 1 disorder (HCC)   . Suicide [facility])     Patient Active Problem List   Diagnosis Date Noted  . Self-inflicted laceration of wrist 07/12/2016  . Borderline personality disorder 07/12/2016  . GERD (gastroesophageal reflux disease) 06/02/2016  . Tobacco use disorder 06/02/2016  . Asthma 06/02/2016  . Suicidal ideation 06/01/2016  . Personality disorder 06/01/2016  . Bipolar disorder (HCC) 06/01/2016    Past Surgical History:  Procedure Laterality Date  . CHOLECYSTECTOMY         Home Medications    Prior to Admission medications   Medication Sig Start Date End Date Taking? Authorizing Provider  citalopram  (CELEXA) 40 MG tablet Take 40 mg by mouth daily.   Yes [provider]  divalproex (DEPAKOTE) 500 MG DR tablet Take 1 tablet (500 mg total) by mouth every 12 (twelve) hours. 08/08/16  Yes Clapacs, Jackquline Denmark, MD  amoxicillin (AMOXIL) 500 MG capsule Take 2 capsules (1,000 mg total) by mouth 2 (two) times daily. 07/14/17 07/24/17  Sudie Grumbling, NP  clarithromycin (BIAXIN) 500 MG tablet Take 1 tablet (500 mg total) by mouth 2 (two) times daily. 07/14/17 07/24/17  Sudie Grumbling, NP  OLANZapine (ZYPREXA) 10 MG tablet Take 2 tablets (20 mg total) by mouth at bedtime. 06/29/17   Little, Traci M, PA-C  OLANZapine (ZYPREXA) 5 MG tablet Take 1 tablet (5 mg total) by mouth daily. 06/29/17 06/29/18  Little, Traci M, PA-C  omeprazole (PRILOSEC) 20 MG capsule Take 1 capsule (20 mg total) by mouth 2 (two) times daily before a meal. For 14 days then take 1 capsule daily 07/14/17   Sudie Grumbling, NP  ondansetron (ZOFRAN ODT) 4 MG disintegrating tablet Take 1 tablet (4 mg total) by mouth every 8 (eight) hours as needed for nausea. 07/14/17   Sudie Grumbling, NP    Family History Family History  Problem Relation Age of Onset  . Adopted: Yes    Social History Social History  Substance Use Topics  . Smoking status: Former Smoker    Packs/day: 2.00    Types: Cigarettes  Quit date: 08/18/2013  . Smokeless tobacco: Current User  . Alcohol use No     Allergies   Patient has no known allergies.   Review of Systems Review of Systems  Constitutional: Positive for appetite change. Negative for chills, diaphoresis, fatigue and fever.  HENT: Negative for congestion, mouth sores, postnasal drip, sore throat and trouble swallowing.   Respiratory: Negative for cough, chest tightness, shortness of breath and wheezing.   Cardiovascular: Negative for chest pain, palpitations and leg swelling.  Gastrointestinal: Positive for abdominal pain, diarrhea and nausea. Negative for blood in stool, constipation and  vomiting.  Genitourinary: Negative for decreased urine volume, difficulty urinating, dysuria, flank pain and hematuria.  Musculoskeletal: Negative for arthralgias, back pain, myalgias and neck pain.  Skin: Negative for rash and wound.  Neurological: Negative for dizziness, tremors, syncope, weakness, light-headedness, numbness and headaches.  Hematological: Negative for adenopathy. Does not bruise/bleed easily.  Psychiatric/Behavioral: Positive for dysphoric mood.     Physical Exam Triage Vital Signs ED Triage Vitals [07/14/17 1210]  Enc Vitals Group     BP 129/87     Pulse Rate 62     Resp 20     Temp 98.1 F (36.7 C)     Temp Source Oral     SpO2 98 %     Weight      Height      Head Circumference      Peak Flow      Pain Score      Pain Loc      Pain Edu?      Excl. in GC?    No data found.   Updated Vital Signs BP 129/87 (BP Location: Right Arm)   Pulse 62   Temp 98.1 F (36.7 C) (Oral)   Resp 20   SpO2 98%   Visual Acuity Right Eye Distance:   Left Eye Distance:   Bilateral Distance:    Right Eye Near:   Left Eye Near:    Bilateral Near:     Physical Exam  Constitutional: He is oriented to person, place, and time. He appears well-developed and well-nourished. He is active and cooperative. No distress.  HENT:  Head: Normocephalic and atraumatic.  Right Ear: Hearing and external ear normal.  Left Ear: Hearing and external ear normal.  Nose: Nose normal.  Mouth/Throat: Uvula is midline, oropharynx is clear and moist and mucous membranes are normal. No oropharyngeal exudate.  Eyes: Conjunctivae and EOM are normal.  Neck: Normal range of motion. Neck supple.  Cardiovascular: Normal rate, regular rhythm and normal heart sounds.   No murmur heard. Pulmonary/Chest: Effort normal and breath sounds normal. No respiratory distress. He has no wheezes.  Abdominal: Soft. Normal appearance and bowel sounds are normal. He exhibits no mass. There is no  hepatosplenomegaly. There is generalized tenderness and tenderness in the epigastric area. There is no rigidity, no rebound, no guarding and no CVA tenderness. No hernia.  Musculoskeletal: Normal range of motion.  Lymphadenopathy:    He has no cervical adenopathy.  Neurological: He is alert and oriented to person, place, and time.  Skin: Skin is warm and dry.  Psychiatric: He has a normal mood and affect. His speech is normal and behavior is normal. Judgment and thought content normal. Cognition and memory are normal.     UC Treatments / Results  Labs (all labs ordered are listed, but only abnormal results are displayed) Labs Reviewed  POCT H PYLORI SCREEN - Abnormal; Notable for the  following:       Result Value   H. PYLORI SCREEN, POC POSITIVE (*)    All other components within normal limits    EKG  EKG Interpretation None       Radiology No results found.  Procedures Procedures (including critical care time)  Medications Ordered in UC Medications - No data to display   Initial Impression / Assessment and Plan / UC Course  I have reviewed the triage vital signs and the nursing notes.  Pertinent labs & imaging results that were available during my care of the patient were reviewed by me and considered in my medical decision making (see chart for details).    Discussed with patient and his mom that he has H.Pylor infection which can cause a gastric ulcer. Discussed that he needs to take "triple therapy" medications to resolve symptoms. Start Amoxicillin 1g twice a day as directed. Take Biaxin 500mg  twice a day as directed for 10 days. Use Zofran 4mg  every 8 hours as needed for nausea. Take Omeprazole 20mg  twice a day for 14 days then continue once a day. Discussed possible interaction between Biaxin and current bipolar meds. Eat small, bland foods for next 4 to 5 days. Avoid spicy, fried foods. Follow-up with his PCP in 1 week if not improving. May need additional follow-up  with his GI specialist if symptoms persist or return.   Final Clinical Impressions(s) / UC Diagnoses   Final diagnoses:  H. pylori infection  Abdominal pain, epigastric    New Prescriptions Discharge Medication List as of 07/14/2017  2:56 PM    START taking these medications   Details  amoxicillin (AMOXIL) 500 MG capsule Take 2 capsules (1,000 mg total) by mouth 2 (two) times daily., Starting Fri 07/14/2017, Until Mon 07/24/2017, Normal    clarithromycin (BIAXIN) 500 MG tablet Take 1 tablet (500 mg total) by mouth 2 (two) times daily., Starting Fri 07/14/2017, Until Mon 07/24/2017, Normal    omeprazole (PRILOSEC) 20 MG capsule Take 1 capsule (20 mg total) by mouth 2 (two) times daily before a meal. For 14 days then take 1 capsule daily, Starting Fri 07/14/2017, Normal      also Prescribed ZOFRAN and is listed in disposition but not in above discharge medication list in Epic!  Controlled Substance Prescriptions [place] Controlled Substance Registry consulted? Not Applicable   Sudie Grumbling, NP 07/15/17 [phone]

## 2017-07-14 NOTE — ED Triage Notes (Signed)
Pt here for intermittent abd pain onset 1 week that increases when he earts  Sx also include n/v/diarrhea  Taking Pepto Bismol w/no relief.   A&O x4... NAD... Ambulatory

## 2017-07-14 NOTE — Discharge Instructions (Addendum)
Start Amoxicillin 1g twice a day as directed. Take Biaxin 500mg  twice a day as directed for 10 days. Use Zofran 4mg  every 8 hours as needed for nausea. Take Omeprazole 20mg  twice a day for 14 days then take once a day. Eat small, bland food for next 4 to 5 days. Avoid spicy, fried foods. Follow-up with your PCP in 1 week if not improving.

## 2018-01-01 ENCOUNTER — Emergency Department (HOSPITAL_COMMUNITY): Payer: Self-pay

## 2018-01-01 ENCOUNTER — Encounter (HOSPITAL_COMMUNITY): Payer: Self-pay | Admitting: Emergency Medicine

## 2018-01-01 ENCOUNTER — Emergency Department (HOSPITAL_COMMUNITY)
Admission: EM | Admit: 2018-01-01 | Discharge: 2018-01-01 | Disposition: A | Discharge status: Home / Self Care | Payer: Self-pay | Attending: Emergency Medicine | Admitting: Emergency Medicine

## 2018-01-01 ENCOUNTER — Other Ambulatory Visit: Payer: Self-pay

## 2018-01-01 DIAGNOSIS — S8391XA Sprain of unspecified site of right knee, initial encounter: Secondary | ICD-10-CM | POA: Insufficient documentation

## 2018-01-01 DIAGNOSIS — Y9389 Activity, other specified: Secondary | ICD-10-CM | POA: Insufficient documentation

## 2018-01-01 DIAGNOSIS — Y998 Other external cause status: Secondary | ICD-10-CM | POA: Insufficient documentation

## 2018-01-01 DIAGNOSIS — F17228 Nicotine dependence, chewing tobacco, with other nicotine-induced disorders: Secondary | ICD-10-CM | POA: Insufficient documentation

## 2018-01-01 DIAGNOSIS — J45909 Unspecified asthma, uncomplicated: Secondary | ICD-10-CM | POA: Insufficient documentation

## 2018-01-01 DIAGNOSIS — X509XXA Other and unspecified overexertion or strenuous movements or postures, initial encounter: Secondary | ICD-10-CM | POA: Insufficient documentation

## 2018-01-01 DIAGNOSIS — Y92199 Unspecified place in other specified residential institution as the place of occurrence of the external cause: Secondary | ICD-10-CM | POA: Insufficient documentation

## 2018-01-01 DIAGNOSIS — Z79899 Other long term (current) drug therapy: Secondary | ICD-10-CM | POA: Insufficient documentation

## 2018-01-01 NOTE — Progress Notes (Signed)
Orthopedic Tech Progress Note Patient Details:  Gus PumaBrandon S Labombard 1996-10-29 [id number]  Ortho Devices Type of Ortho Device: Crutches, Knee Immobilizer Ortho Device/Splint Location: RLE Ortho Device/Splint Interventions: Ordered, Application, Adjustment   Post Interventions Patient Tolerated: Well Instructions Provided: Care of device   Jennye MoccasinHughes, Teniya Filter Craig 01/01/2018, 3:14 PM

## 2018-01-01 NOTE — ED Notes (Signed)
Mother is on the way to pick pt up on discharge
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
## 2018-02-20 IMAGING — CR DG KNEE COMPLETE 4+V*L*
4 series · 4 of 4 positions shown · non-contrast
Comparison: None.

CLINICAL DATA: The patient's left knee gave way while walking
today. Pain. Initial encounter.

EXAM:
LEFT KNEE - COMPLETE 4+ VIEW

[knee ap]
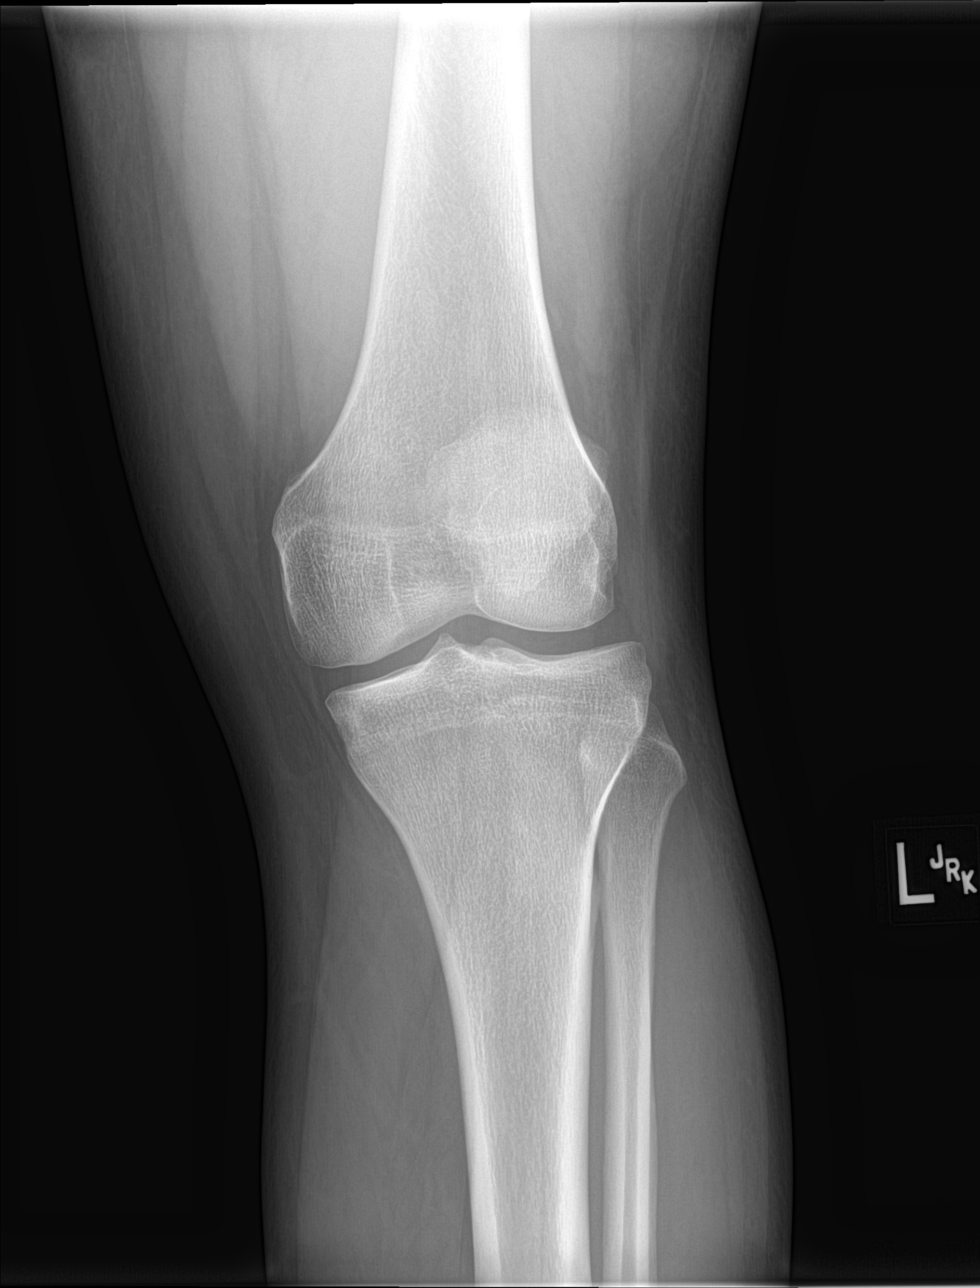

[knee obl (1 of 2)]
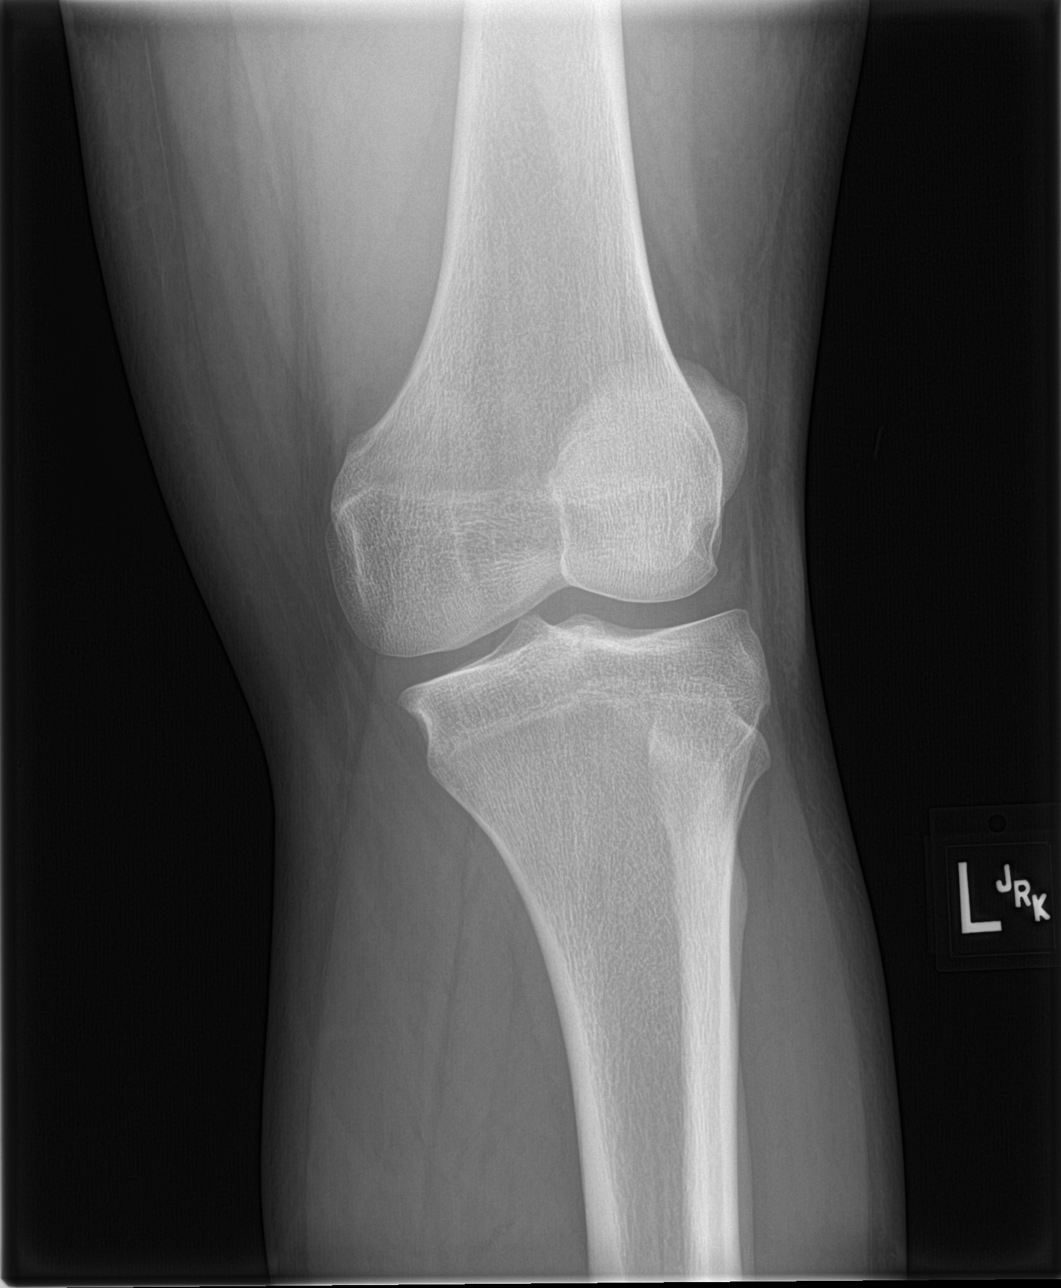

[knee obl (2 of 2)]
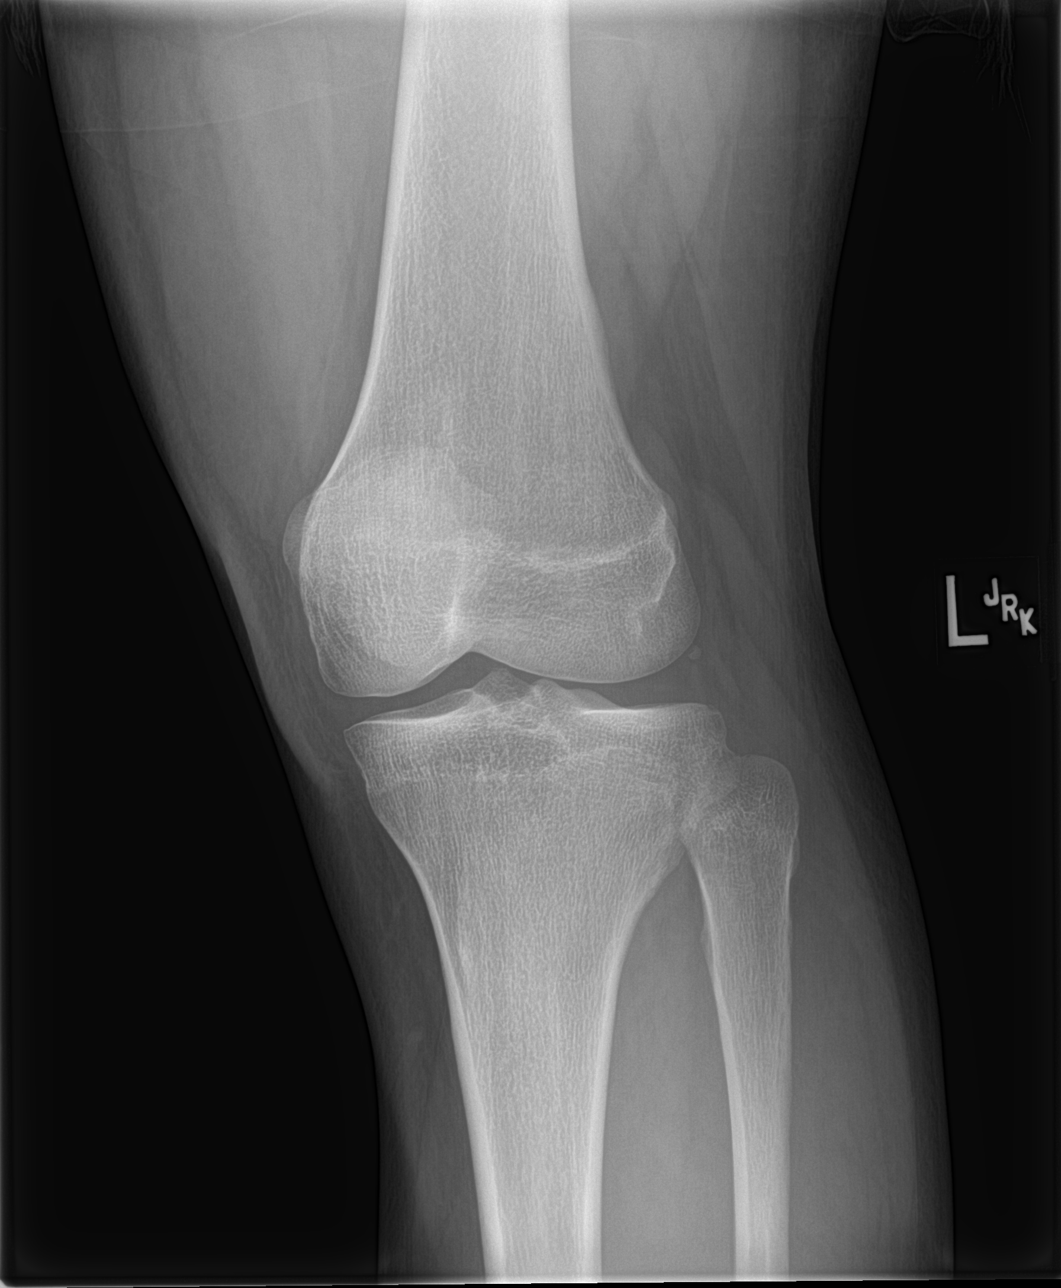

[knee lat]
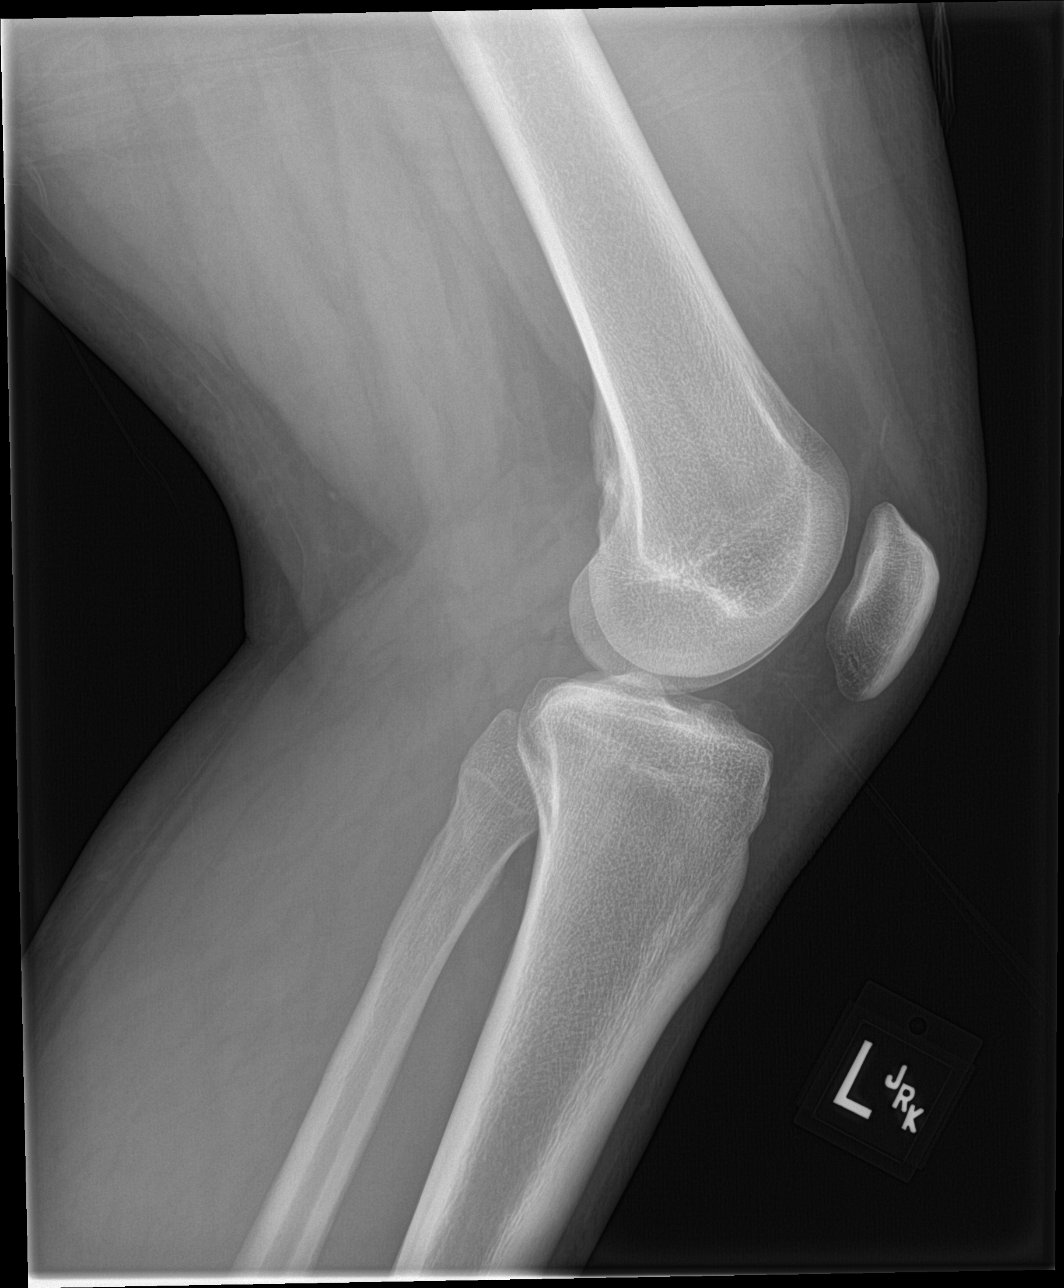

[4 of 4 positions shown; findings below may reference images not displayed]

FINDINGS: No evidence of fracture, dislocation, or joint effusion. No evidence
of arthropathy or other focal bone abnormality. Soft tissues are
unremarkable.
IMPRESSION: Normal exam.

## 2018-06-06 ENCOUNTER — Encounter (HOSPITAL_COMMUNITY): Payer: Self-pay | Admitting: *Deleted

## 2018-06-06 ENCOUNTER — Encounter (HOSPITAL_COMMUNITY): Payer: Self-pay

## 2018-06-06 ENCOUNTER — Emergency Department (HOSPITAL_COMMUNITY)
Admission: EM | Admit: 2018-06-06 | Discharge: 2018-06-06 | Disposition: A | Discharge status: Other Acute Inpatient Hospital | Payer: Medicaid Other | Attending: Emergency Medicine | Admitting: Emergency Medicine

## 2018-06-06 ENCOUNTER — Inpatient Hospital Stay (HOSPITAL_COMMUNITY)
Admission: AD | Admit: 2018-06-06 | Discharge: 2018-06-11 | DRG: 885 | Disposition: A | Discharge status: Home / Self Care | Payer: Medicaid Other | Source: Intra-hospital | Attending: Emergency Medicine | Admitting: Emergency Medicine

## 2018-06-06 ENCOUNTER — Other Ambulatory Visit: Payer: Self-pay

## 2018-06-06 DIAGNOSIS — F603 Borderline personality disorder: Secondary | ICD-10-CM | POA: Diagnosis present

## 2018-06-06 DIAGNOSIS — F17220 Nicotine dependence, chewing tobacco, uncomplicated: Secondary | ICD-10-CM | POA: Diagnosis present

## 2018-06-06 DIAGNOSIS — G4700 Insomnia, unspecified: Secondary | ICD-10-CM | POA: Diagnosis present

## 2018-06-06 DIAGNOSIS — F314 Bipolar disorder, current episode depressed, severe, without psychotic features: Secondary | ICD-10-CM | POA: Diagnosis present

## 2018-06-06 DIAGNOSIS — Z046 Encounter for general psychiatric examination, requested by authority: Secondary | ICD-10-CM | POA: Insufficient documentation

## 2018-06-06 DIAGNOSIS — Z62810 Personal history of physical and sexual abuse in childhood: Secondary | ICD-10-CM | POA: Diagnosis present

## 2018-06-06 DIAGNOSIS — R45851 Suicidal ideations: Secondary | ICD-10-CM | POA: Insufficient documentation

## 2018-06-06 DIAGNOSIS — F4310 Post-traumatic stress disorder, unspecified: Secondary | ICD-10-CM | POA: Diagnosis present

## 2018-06-06 DIAGNOSIS — J45909 Unspecified asthma, uncomplicated: Secondary | ICD-10-CM | POA: Insufficient documentation

## 2018-06-06 DIAGNOSIS — S161XXA Strain of muscle, fascia and tendon at neck level, initial encounter: Secondary | ICD-10-CM | POA: Diagnosis not present

## 2018-06-06 DIAGNOSIS — Y92239 Unspecified place in hospital as the place of occurrence of the external cause: Secondary | ICD-10-CM | POA: Diagnosis not present

## 2018-06-06 DIAGNOSIS — Z781 Physical restraint status: Secondary | ICD-10-CM

## 2018-06-06 DIAGNOSIS — Z915 Personal history of self-harm: Secondary | ICD-10-CM

## 2018-06-06 DIAGNOSIS — F411 Generalized anxiety disorder: Secondary | ICD-10-CM | POA: Diagnosis present

## 2018-06-06 DIAGNOSIS — K219 Gastro-esophageal reflux disease without esophagitis: Secondary | ICD-10-CM | POA: Diagnosis present

## 2018-06-06 DIAGNOSIS — F1010 Alcohol abuse, uncomplicated: Secondary | ICD-10-CM | POA: Diagnosis present

## 2018-06-06 DIAGNOSIS — X58XXXA Exposure to other specified factors, initial encounter: Secondary | ICD-10-CM | POA: Diagnosis not present

## 2018-06-06 DIAGNOSIS — Z79899 Other long term (current) drug therapy: Secondary | ICD-10-CM | POA: Diagnosis not present

## 2018-06-06 DIAGNOSIS — S8001XA Contusion of right knee, initial encounter: Secondary | ICD-10-CM | POA: Diagnosis not present

## 2018-06-06 DIAGNOSIS — F4541 Pain disorder exclusively related to psychological factors: Secondary | ICD-10-CM

## 2018-06-06 DIAGNOSIS — F419 Anxiety disorder, unspecified: Secondary | ICD-10-CM | POA: Diagnosis not present

## 2018-06-06 DIAGNOSIS — Z87891 Personal history of nicotine dependence: Secondary | ICD-10-CM | POA: Insufficient documentation

## 2018-06-06 DIAGNOSIS — F4542 Pain disorder with related psychological factors: Secondary | ICD-10-CM

## 2018-06-06 LAB — ACETAMINOPHEN LEVEL

## 2018-06-06 LAB — COMPREHENSIVE METABOLIC PANEL
ALT: 46 U/L — ABNORMAL HIGH (ref 0–44)
ANION GAP: 11 (ref 5–15)
AST: 24 U/L (ref 15–41)
Albumin: 4.3 g/dL (ref 3.5–5.0)
Alkaline Phosphatase: 39 U/L (ref 38–126)
BUN: 19 mg/dL (ref 6–20)
CALCIUM: 9.5 mg/dL (ref 8.9–10.3)
CHLORIDE: 101 mmol/L (ref 98–111)
CO2: 27 mmol/L (ref 22–32)
Creatinine, Ser: 0.57 mg/dL — ABNORMAL LOW (ref 0.61–1.24)
GFR calc non Af Amer: >60 mL/min (ref >60)
Glucose, Bld: 115 mg/dL — ABNORMAL HIGH (ref 70–99)
POTASSIUM: 4.1 mmol/L (ref 3.5–5.1)
SODIUM: 139 mmol/L (ref 135–145)
Total Bilirubin: 0.6 mg/dL (ref 0.3–1.2)
Total Protein: 7.4 g/dL (ref 6.5–8.1)

## 2018-06-06 LAB — RAPID URINE DRUG SCREEN, HOSP PERFORMED
Amphetamines: NOT DETECTED
BENZODIAZEPINES: NOT DETECTED
COCAINE: NOT DETECTED
OPIATES: NOT DETECTED
TETRAHYDROCANNABINOL: NOT DETECTED

## 2018-06-06 LAB — CBC
HCT: 44.6 % (ref 39.0–52.0)
HEMOGLOBIN: 15.1 g/dL (ref 13.0–17.0)
MCH: 31.5 pg (ref 26.0–34.0)
MCHC: 33.9 g/dL (ref 30.0–36.0)
MCV: 93.1 fL (ref 78.0–100.0)
Platelets: 283 10*3/uL (ref 150–400)
RBC: 4.79 MIL/uL (ref 4.22–5.81)
RDW: 14 % (ref 11.5–15.5)
WBC: 8.6 10*3/uL (ref 4.0–10.5)

## 2018-06-06 LAB — SALICYLATE LEVEL

## 2018-06-06 LAB — ETHANOL: Alcohol, Ethyl (B): <10 mg/dL (ref <10)

## 2018-06-06 MED ORDER — QUETIAPINE FUMARATE 50 MG PO TABS
50.0000 mg | ORAL_TABLET | Freq: Once | ORAL | Status: AC
  Administered 2018-06-06: 50 mg via ORAL
  Filled 2018-06-06 (×2): qty 1

## 2018-06-06 MED ORDER — MELOXICAM 7.5 MG PO TABS
15.0000 mg | ORAL_TABLET | Freq: Every day | ORAL | Status: DC
  Administered 2018-06-07 – 2018-06-11 (×5): 15 mg via ORAL
  Filled 2018-06-06 (×3): qty 1
  Filled 2018-06-06: qty 20
  Filled 2018-06-06 (×4): qty 1

## 2018-06-06 MED ORDER — MELOXICAM 15 MG PO TABS
15.0000 mg | ORAL_TABLET | Freq: Every day | ORAL | Status: DC
  Administered 2018-06-06: 15 mg via ORAL
  Filled 2018-06-06: qty 1

## 2018-06-06 MED ORDER — CITALOPRAM HYDROBROMIDE 10 MG PO TABS
40.0000 mg | ORAL_TABLET | Freq: Every day | ORAL | Status: DC
  Administered 2018-06-06: 40 mg via ORAL
  Filled 2018-06-06: qty 4

## 2018-06-06 MED ORDER — ALUM & MAG HYDROXIDE-SIMETH 200-200-20 MG/5ML PO SUSP
30.0000 mL | ORAL | Status: DC | PRN
  Administered 2018-06-09: 30 mL via ORAL
  Filled 2018-06-06: qty 30

## 2018-06-06 MED ORDER — HYDROXYZINE HCL 25 MG PO TABS
25.0000 mg | ORAL_TABLET | Freq: Three times a day (TID) | ORAL | Status: DC | PRN
  Administered 2018-06-06 – 2018-06-07 (×2): 25 mg via ORAL
  Filled 2018-06-06 (×2): qty 1

## 2018-06-06 MED ORDER — ACETAMINOPHEN 325 MG PO TABS
650.0000 mg | ORAL_TABLET | Freq: Four times a day (QID) | ORAL | Status: DC | PRN
  Administered 2018-06-06: 650 mg via ORAL
  Filled 2018-06-06: qty 2

## 2018-06-06 MED ORDER — DIVALPROEX SODIUM 500 MG PO DR TAB
500.0000 mg | DELAYED_RELEASE_TABLET | Freq: Two times a day (BID) | ORAL | Status: DC
  Administered 2018-06-06: 500 mg via ORAL
  Filled 2018-06-06: qty 1

## 2018-06-06 MED ORDER — MAGNESIUM HYDROXIDE 400 MG/5ML PO SUSP: 30.0000 mL | Freq: Every day | ORAL | Status: DC | PRN

## 2018-06-06 MED ORDER — CITALOPRAM HYDROBROMIDE 40 MG PO TABS
40.0000 mg | ORAL_TABLET | Freq: Every day | ORAL | Status: DC
  Administered 2018-06-07 – 2018-06-11 (×5): 40 mg via ORAL
  Filled 2018-06-06 (×5): qty 1
  Filled 2018-06-06: qty 10
  Filled 2018-06-06 (×2): qty 1

## 2018-06-06 MED ORDER — NICOTINE POLACRILEX 2 MG MT GUM
2.0000 mg | CHEWING_GUM | OROMUCOSAL | Status: DC | PRN
Start: 2018-06-06 — End: 2018-06-11
  Administered 2018-06-06 – 2018-06-11 (×16): 2 mg via ORAL
  Filled 2018-06-06 (×4): qty 1

## 2018-06-06 MED ORDER — DIVALPROEX SODIUM 500 MG PO DR TAB
500.0000 mg | DELAYED_RELEASE_TABLET | Freq: Two times a day (BID) | ORAL | Status: DC
  Administered 2018-06-06 – 2018-06-07 (×2): 500 mg via ORAL
  Filled 2018-06-06 (×7): qty 1

## 2018-06-06 NOTE — Plan of Care (Signed)
D: Pt denies SI/HI/AVH. Pt is pleasant and cooperative. Pt stated he has been having issues sleeping, pt stated he was on Seroquel 800 mg for years. Pt said the only things that helped him sleep were Seroquel and Ativan  A: Pt was offered support and encouragement. Pt was given scheduled medications. Pt was encourage to attend groups. Q 15 minute checks were done for safety.   R:Pt attends groups and interacts well with peers and staff. Pt is taking medication. Pt has no complaints.Pt receptive to treatment and safety maintained on unit.   Problem: Education: Goal: Emotional status will improve Outcome: Progressing   Problem: Education: Goal: Mental status will improve Outcome: Progressing   Problem: Activity: Goal: Interest or engagement in activities will improve Outcome: Progressing   Problem: Activity: Goal: Sleeping patterns will improve Outcome: Progressing
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
## 2018-06-06 NOTE — Progress Notes (Signed)
Kyle Wang is a 22 year old male pt admitted on involuntary basis. On admission he denies SI and is able to contract for safety while in the hospital. He reports that he believes he is going through a manic phase and thinks that he needs a medication adjustment. He reports that he sees Dr. Jannifer FranklinAkintayo for medications and reports that he is taking them as prescribed but reports he feels they are not working at this time. He does report daily alcohol usage but denies any drug use. He does not display any overt signs or symptoms of withdrawal on admission. He reports that his mother has power-of-attorney over him. He reports that he lives with mom and brother and reports that he will probably go back there once he is discharged. Kyle Wang was oriented to the unit and safety maintained.

## 2018-06-06 NOTE — Progress Notes (Signed)
Patient denied SI and HI, contracts for safety.  Denied A/V hallucinations.  Patient wears brace on R leg.  Stated he will have surgery on R leg in couple of weeks.  Respirations even and unlabored.  No signs/symptoms of pain/distress on patient's face/body movements.  Safety maintained with 15 minute checks.
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
## 2018-06-06 NOTE — BH Assessment (Signed)
[facility]  Per Juanetta BeetsJacqueline Norman, DO, this pt requires psychiatric hospitalization.  Malva LimesLinsey Strader, RN, [facility]  Pt presents under IVC initiated by law enforcement, and upheld by Dr Sharma CovertNorman, and [facility] along with pt's guardianship documents.  Pt's nurse has been notified, and agrees to call report to [phone].  Pt is to be transported via Patent examinerlaw enforcement.  Danny Sprinkle, TS, agrees to call pt's guardian, Melba CoonRose Marie Cush [phone]) to notify her.   Doylene Canninghomas Cierria Height, [place] Behavioral Health Coordinator [phone]

## 2018-06-06 NOTE — Progress Notes (Signed)
Pt attended NA group this evening.  

## 2018-06-06 NOTE — ED Provider Notes (Signed)
[facility]-EMERGENCY DEPT Provider Note   CSN: [id number] Arrival date & time: 06/06/18  0306     History   Chief Complaint Chief Complaint  Patient presents with  . IVC  . Suicidal    HPI Kyle Wang is a 22 y.o. male.  HPI 22 year old male past medical history significant for bipolar disorder and prior suicide attempt presents to the ED today by [employer] with IVC paperwork for suicidal attempt.  Patient had a knife to his throat tonight and was making "death by cop" threats.  Patient states that he did this to get his mom's attention because he does not like living with her but he does not currently want to hurt himself.  Patient states that his mother was total control over his life.  Patient denies homicidal ideations, auditory or visual hallucinations.  Denies any drug or alcohol use.  Reports occasional tobacco use.  Patient does report pain to his right knee from prior ACL tear that he is currently taking meloxicam for otherwise he denies any other pain. Past Medical History:  Diagnosis Date  . Asthma   . Bipolar 1 disorder (HCC)   . Suicide [facility])     Patient Active Problem List   Diagnosis Date Noted  . Self-inflicted laceration of wrist 07/12/2016  . Borderline personality disorder (HCC) 07/12/2016  . GERD (gastroesophageal reflux disease) 06/02/2016  . Tobacco use disorder 06/02/2016  . Asthma 06/02/2016  . Suicidal ideation 06/01/2016  . Personality disorder (HCC) 06/01/2016  . Bipolar disorder (HCC) 06/01/2016    Past Surgical History:  Procedure Laterality Date  . CHOLECYSTECTOMY          Home Medications    Prior to Admission medications   Medication Sig Start Date End Date Taking? Authorizing Provider  citalopram (CELEXA) 40 MG tablet Take 40 mg by mouth daily.   Yes [provider]  divalproex (DEPAKOTE) 500 MG DR tablet Take 1 tablet (500 mg total) by mouth every 12 (twelve) hours. 08/08/16  Yes Clapacs, Jackquline Denmark,  MD  meloxicam (MOBIC) 15 MG tablet Take 15 mg by mouth daily.   Yes [provider]  OLANZapine (ZYPREXA) 10 MG tablet Take 2 tablets (20 mg total) by mouth at bedtime. Patient not taking: Reported on 06/06/2018 06/29/17   Little, Traci M, PA-C  OLANZapine (ZYPREXA) 5 MG tablet Take 1 tablet (5 mg total) by mouth daily. Patient not taking: Reported on 06/06/2018 06/29/17 06/29/18  Little, Traci M, PA-C  omeprazole (PRILOSEC) 20 MG capsule Take 1 capsule (20 mg total) by mouth 2 (two) times daily before a meal. For 14 days then take 1 capsule daily Patient not taking: Reported on 06/06/2018 07/14/17   Sudie Grumbling, NP  ondansetron (ZOFRAN ODT) 4 MG disintegrating tablet Take 1 tablet (4 mg total) by mouth every 8 (eight) hours as needed for nausea. Patient not taking: Reported on 06/06/2018 07/14/17   Sudie Grumbling, NP    Family History Family History  Adopted: Yes    Social History Social History   Tobacco Use  . Smoking status: Former Smoker    Packs/day: 2.00    Types: Cigarettes    Last attempt to quit: 08/18/2013    Years since quitting: 4.8  . Smokeless tobacco: Current User    Types: Chew  Substance Use Topics  . Alcohol use: No  . Drug use: No     Allergies   Patient has no known allergies.   Review of Systems  Review of Systems  All other systems reviewed and are negative.    Physical Exam Updated Vital Signs BP 110/60 (BP Location: Left Arm)   Pulse 66   Temp 97.8 F (36.6 C) (Oral)   Resp 20   Ht 5\' 8"  (1.727 m)   Wt 103.4 kg (228 lb)   SpO2 96%   BMI 34.67 kg/m   Physical Exam  Constitutional: He appears well-developed and well-nourished. No distress.  HENT:  Head: Normocephalic and atraumatic.  Eyes: Right eye exhibits no discharge. Left eye exhibits no discharge. No scleral icterus.  Neck: Normal range of motion.  Pulmonary/Chest: No respiratory distress.  Musculoskeletal: Normal range of motion.  Neurological: He is alert.  Skin: No  pallor.  Psychiatric: His behavior is normal. Judgment and thought content normal.  Nursing note and vitals reviewed.    ED Treatments / Results  Labs (all labs ordered are listed, but only abnormal results are displayed) Labs Reviewed  COMPREHENSIVE METABOLIC PANEL - Abnormal; Notable for the following components:      Result Value   Glucose, Bld 115 (*)    Creatinine, Ser 0.57 (*)    ALT 46 (*)    All other components within normal limits  ACETAMINOPHEN LEVEL - Abnormal; Notable for the following components:   Acetaminophen (Tylenol), Serum <10 (*)    All other components within normal limits  RAPID URINE DRUG SCREEN, HOSP PERFORMED - Abnormal; Notable for the following components:   Barbiturates   (*)    Value: Result not available. Reagent lot number recalled by manufacturer.   All other components within normal limits  ETHANOL  SALICYLATE LEVEL  CBC    EKG None  Radiology No results found.  Procedures Procedures (including critical care time)  Medications Ordered in ED Medications  citalopram (CELEXA) tablet 40 mg (has no administration in time range)  divalproex (DEPAKOTE) DR tablet 500 mg (has no administration in time range)  meloxicam (MOBIC) tablet 15 mg (has no administration in time range)     Initial Impression / Assessment and Plan / ED Course  I have reviewed the triage vital signs and the nursing notes.  Pertinent labs & imaging results that were available during my care of the patient were reviewed by me and considered in my medical decision making (see chart for details).     Patient resents to the ED for suicidal ideations that he currently denies.  States that he did this and attempt to get back at his mom so he can move out from her house.  Patient does have prior suicide attempt.  Medical screening lab work reassuring.  Exam reassuring.  Patient has no medical complaints at this time.  He can medically cleared for TTS evaluation and  disposition.  Final Clinical Impressions(s) / ED Diagnoses   Final diagnoses:  Suicidal ideation    ED Discharge Orders    None       Wallace KellerLeaphart, Aveion Nguyen T, PA-C 06/06/18 0701    Nira Connardama, Pedro Eduardo, MD 06/06/18 [phone]

## 2018-06-06 NOTE — ED Notes (Signed)
Bed: WTR9 Expected date:  Expected time:  Means of arrival:  Comments: 

## 2018-06-06 NOTE — BHH Group Notes (Signed)
BHH Group Notes:  (Nursing/MHT/Case Management/Adjunct)  Date:  06/06/2018  Time:  4:30 pm  Type of Therapy:  Psychoeducational Skills  Participation Level:  Did Not Attend  Participation Quality:    Affect:    Cognitive:    Insight:   Engagement in Group:    Modes of Intervention:    Summary of Progress/Problems:  Kyle Wang, Kyle Wang 06/06/2018, 6:32 PM

## 2018-06-06 NOTE — Tx Team (Signed)
Initial Treatment Plan 06/06/2018 1:37 PM Kyle Wang [id number]    PATIENT STRESSORS: Marital or family conflict Medication change or noncompliance   PATIENT STRENGTHS: Ability for insight Average or above average intelligence Capable of independent living General fund of knowledge   PATIENT IDENTIFIED PROBLEMS: Depression Suicidal thoughts "I'm in a manic phase and I think I need a medication adjustment"                     DISCHARGE CRITERIA:  Ability to meet basic life and health needs Improved stabilization in mood, thinking, and/or behavior Verbal commitment to aftercare and medication compliance  PRELIMINARY DISCHARGE PLAN: Attend aftercare/continuing care group Return to previous living arrangement  PATIENT/FAMILY INVOLVEMENT: This treatment plan has been presented to and reviewed with the patient, Kyle Wang, and/or family member, .  The patient and family have been given the opportunity to ask questions and make suggestions.  [name], [place], [place]RN 06/06/2018, 1:37 PM

## 2018-06-06 NOTE — BH Assessment (Signed)
B[facility] Assessment Progress Note    [facility]  [facility].

## 2018-06-06 NOTE — ED Notes (Addendum)
Pt stated "My mother has control of my life.  I was dx'd as bipolar I when I was 9.  She put me in a group home where I stayed for 13 months.  I just got out about 2 months ago.  I didn't really want to hurt myself but she took my phone away and I had been on the phone talking to a girl.  I told her was going to have to call the police so she told me to f---- go to my room.  She also told me today to come on.  So I get my shoes on go outside and she said what are you doing?  You're not going anywhere.  She locked all the cars and doors to the house and I had to sit outside x 4 hours.  She got Guardianship when I was hospitalized when I was 16 and I'm in the process now of trying to get it taken away from her.  I promised my dad, when he died 2 years ago, I would never try to hurt myself again.  I have 2 brothers.  1 lives with us but is handicapped and my other brother is a Librarian, academicdoctor.  My mother wants total control over my life."  Custom knee brace remains in room with pt.   Pt stated "I have a torn ACL and they're supposed to be doing surgery @ [facility] soon."

## 2018-06-07 LAB — VALPROIC ACID LEVEL: Valproic Acid Lvl: 69 ug/mL (ref 50.0–100.0)

## 2018-06-07 MED ORDER — MELATONIN 3 MG PO TABS
3.0000 mg | ORAL_TABLET | Freq: Every day | ORAL | Status: DC
  Administered 2018-06-07: 3 mg via ORAL
  Filled 2018-06-07: qty 1

## 2018-06-07 MED ORDER — NON FORMULARY
3.0000 mg | Freq: Every day | Status: DC
Start: 2018-06-07 — End: 2018-06-07

## 2018-06-07 MED ORDER — DIVALPROEX SODIUM ER 500 MG PO TB24
1000.0000 mg | ORAL_TABLET | Freq: Every day | ORAL | Status: DC
  Filled 2018-06-07: qty 2

## 2018-06-07 MED ORDER — DIVALPROEX SODIUM ER 500 MG PO TB24
2000.0000 mg | ORAL_TABLET | Freq: Every day | ORAL | Status: AC
  Administered 2018-06-07: 2000 mg via ORAL
  Filled 2018-06-07 (×2): qty 4

## 2018-06-07 MED ORDER — DIVALPROEX SODIUM ER 500 MG PO TB24
1000.0000 mg | ORAL_TABLET | Freq: Every day | ORAL | Status: DC
  Filled 2018-06-07 (×2): qty 2

## 2018-06-07 MED ORDER — RISPERIDONE 1 MG PO TABS
1.0000 mg | ORAL_TABLET | Freq: Once | ORAL | Status: AC
  Administered 2018-06-07: 1 mg via ORAL
  Filled 2018-06-07 (×2): qty 1

## 2018-06-07 MED ORDER — MELATONIN 3 MG PO TABS
ORAL_TABLET | Freq: Every day | ORAL | Status: DC
  Filled 2018-06-07: qty 1
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
## 2018-06-07 NOTE — Plan of Care (Signed)
  D: Pt denies SI/HI/AVH. Pt is pleasant and cooperative. Pt stated he was put on UR earlier due to a misunderstanding. Pt stated he understood and was hoping he could get off tomorrow , because he really like going to the gym "so , I can throw the football around".  A: Pt was offered support and encouragement. Pt was given scheduled medications. Pt was encourage to attend groups. Q 15 minute checks were done for safety.  R:Pt attends groups and interacts well with peers and staff. Pt is taking medication. Pt has no complaints.Pt receptive to treatment and safety maintained on unit.  Problem: Education: Goal: Emotional status will improve Outcome: Progressing   Problem: Education: Goal: Mental status will improve Outcome: Progressing   Problem: Activity: Goal: Interest or engagement in activities will improve Outcome: Progressing   Problem: Activity: Goal: Sleeping patterns will improve Outcome: Progressing   Problem: Coping: Goal: Ability to demonstrate self-control will improve Outcome: Progressing   Problem: Safety: Goal: Ability to remain free from injury will improve Outcome: Progressing
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
## 2018-06-07 NOTE — Plan of Care (Addendum)
  Problem: Medication: Goal: Compliance with prescribed medication regimen will improve Outcome: Progressing   Problem: Safety: Goal: Ability to disclose and discuss suicidal ideas will improve Outcome: Progressing D: Pt visible in milieu on initial contact. A &O X3. Denies SI, HI, AVH and pain. Presents animated, hyperactive and intrusive on interactions. Reports racing thoughts with threats of self harm earlier this shift "I'm thinking of running through that door, what if I escape right now, what if I hurt myself too". Rates his depression 7/10, hopelessness 3/10 and anxiety 3/10. Pt noted in groups and was engaged. Verbal outburst X1 related to order for unit restriction. Pt took his shirt off, threatening "to go off, I feel like I'm being punished just because I said the truth". Pt was verbally redirectable and comply with unit restriction.  A: Introduced self to pt. Emotional support and availability provided to pt. All medications administered with verbal education and effects monitored. Unit restriction initiated for safety / threats of self harm. Safety checks maintained at Q 15 minutes intervals self harm gestures.   R: Pt receptive to care. Compliant with medications when offered. Denies adverse drug reactions when assessed this shift. Tolerates all PO intake well. Remains safe on and off unit.

## 2018-06-07 NOTE — Progress Notes (Signed)
Adult Psychoeducational Group Note  Date:  06/07/2018 Time:  11:00 PM  Group Topic/Focus:  Wrap-Up Group:   The focus of this group is to help patients review their daily goal of treatment and discuss progress on daily workbooks.  Participation Level:  Active  Participation Quality:  Appropriate  Affect:  Anxious  Cognitive:  Appropriate  Insight: Appropriate  Engagement in Group:  Engaged  Modes of Intervention:  Discussion  Additional Comments: Pt attended group but fell asleep at the end.  Pt stated it was because of the sleep medication given.  Pt rated the day at 6/10.  Dominga Mcduffie 06/07/2018, 11:00 PM
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
## 2018-06-07 NOTE — [facility] Group Notes (Signed)
LCSW Group Therapy Note  06/07/2018 1:15pm  Type of Therapy/Topic:  Group Therapy:  Balance in Life  Participation Level:  Active  Description of Group:   This group will address the concept of balance and how it feels and looks when one is unbalanced. Patients will be encouraged to process areas in their lives that are out of balance and identify reasons for remaining unbalanced. Facilitators will guide patients in utilizing problem-solving interventions to address and correct the stressor making their life unbalanced. Understanding and applying boundaries will be explored and addressed for obtaining and maintaining a balanced life. Patients will be encouraged to explore ways to assertively make their unbalanced needs known to significant others in their lives, using other group members and facilitator for support and feedback.  Therapeutic Goals: 1. Patient will identify two or more emotions or situations they have that consume much of in their lives. 2. Patient will identify signs/triggers that life has become out of balance:  3. Patient will identify two ways to set boundaries in order to achieve balance in their lives:  4. Patient will demonstrate ability to communicate their needs through discussion and/or role plays  Summary of Patient Progress: Patient was welcomed into group, and stated he felt "motivated" and elaborated, stating he felt hopeful after meeting with the peer support specialist.Patient actively participated in group discussion about balance in life. Patient defined what balance in life meant, and identified stressors one has to balance in life (family, finances, relationships, mental health). Patient participated in group activity, where members were asked to illustrate a pie chart representing how they spend their energy on different things. Patient identified spending the most energy on his childhood trauma history and grief. Patient identified sign that  life has become out  of balance as, "my relationships around me start getting impacted." Patient created an additional pie chart, representing what a more balanced life would look like. Patient identified, "I am going to start focusing on myself more, and how I perceive those around me" as a change to lead a more balanced life.   Therapeutic Modalities:   Cognitive Behavioral Therapy Solution-Focused Therapy Assertiveness Training  Kyle Mollyerri A Oanh Devivo, LCSW 06/07/2018 2:43 PM

## 2018-06-07 NOTE — [facility] Suicide Risk Assessment (Signed)
[facility] INPATIENT:  Family/Significant Other Suicide Prevention Education  Suicide Prevention Education:  Education Completed; Kyle LeaderRosemarie Carswell (Mother/Legal Guardian [phone]) has been identified by the patient as the family member/significant other with whom the patient will be residing, and identified as the person(s) who will aid the patient in the event of a mental health crisis (suicidal ideations/suicide attempt).  With written consent from the patient, the family member/significant other has been provided the following suicide prevention education, prior to the and/or following the discharge of the patient.  The suicide prevention education provided includes the following:  Suicide risk factors  Suicide prevention and interventions  National Suicide Hotline telephone number  [facility] assessment telephone number  [facility] Emergency Assistance 911  [facility] and/or Residential Mobile Crisis Unit telephone number  Request made of family/significant other to:  Remove weapons (e.g., guns, rifles, knives), all items previously/currently identified as safety concern.    Remove drugs/medications (over-the-counter, prescriptions, illicit drugs), all items previously/currently identified as a safety concern.  The family member/significant other verbalizes understanding of the suicide prevention education information provided.  The family member/significant other agrees to remove the items of safety concern listed above. Parent stated that patient "had been doing well", and had recently stepped down from the group home and had returned to his mother's home on June 1. Stated, things started spiraling when he was in her home, and states she does not want patient to return to her home. Parent is exploring options with [facility] Coord[facility] about other possible options. Parent states there are no guns/weapons in her home. Parent states patient had recently found  razor blades from a box cutter that was in the garage. Parent stated while patient has been hospitalized, she removed all razors, knives, scissors and is keeping them secured. CSW requested parent secure medications, and alcohol, and monitor patient's medication management if he were to return to the home. Parent agreed to do so, is understanding of crisis phone numbers and what to do if patient is in an crisis in the future.  Magdalene MollyPerri A Corona Popovich, LCSW 06/07/2018, 4:54 PM
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
## 2018-06-08 ENCOUNTER — Inpatient Hospital Stay (HOSPITAL_COMMUNITY): Payer: Medicaid Other

## 2018-06-08 ENCOUNTER — Other Ambulatory Visit: Payer: Self-pay

## 2018-06-08 ENCOUNTER — Encounter (HOSPITAL_COMMUNITY): Payer: Self-pay | Admitting: Emergency Medicine

## 2018-06-08 DIAGNOSIS — Z87891 Personal history of nicotine dependence: Secondary | ICD-10-CM

## 2018-06-08 DIAGNOSIS — F419 Anxiety disorder, unspecified: Secondary | ICD-10-CM

## 2018-06-08 DIAGNOSIS — G4700 Insomnia, unspecified: Secondary | ICD-10-CM

## 2018-06-08 MED ORDER — HALOPERIDOL 5 MG PO TABS
5.0000 mg | ORAL_TABLET | Freq: Four times a day (QID) | ORAL | Status: DC | PRN
  Administered 2018-06-10: 5 mg via ORAL
  Filled 2018-06-08 (×3): qty 1

## 2018-06-08 MED ORDER — MELATONIN 3 MG PO TABS
3.0000 mg | ORAL_TABLET | Freq: Every day | ORAL | Status: DC
  Filled 2018-06-08 (×2): qty 1

## 2018-06-08 MED ORDER — DIPHENHYDRAMINE HCL 50 MG/ML IJ SOLN
INTRAMUSCULAR | Status: AC
  Filled 2018-06-08: qty 1

## 2018-06-08 MED ORDER — LORAZEPAM 2 MG/ML IJ SOLN
2.0000 mg | Freq: Four times a day (QID) | INTRAMUSCULAR | Status: DC | PRN
Start: 2018-06-08 — End: 2018-06-11

## 2018-06-08 MED ORDER — LORAZEPAM 2 MG/ML IJ SOLN
INTRAMUSCULAR | Status: AC
  Filled 2018-06-08: qty 1

## 2018-06-08 MED ORDER — MELATONIN 3 MG PO TABS
3.0000 mg | ORAL_TABLET | Freq: Every day | ORAL | Status: DC
  Administered 2018-06-09 – 2018-06-10 (×3): 3 mg via ORAL
  Filled 2018-06-08 (×2): qty 1
  Filled 2018-06-08: qty 10
  Filled 2018-06-08 (×3): qty 1

## 2018-06-08 MED ORDER — LORAZEPAM 1 MG PO TABS
2.0000 mg | ORAL_TABLET | Freq: Four times a day (QID) | ORAL | Status: DC | PRN
Start: 2018-06-08 — End: 2018-06-11
  Administered 2018-06-09 – 2018-06-10 (×3): 2 mg via ORAL
  Filled 2018-06-08 (×4): qty 2

## 2018-06-08 MED ORDER — DIPHENHYDRAMINE HCL 25 MG PO CAPS
50.0000 mg | ORAL_CAPSULE | Freq: Four times a day (QID) | ORAL | Status: DC | PRN
  Administered 2018-06-09 – 2018-06-10 (×2): 50 mg via ORAL
  Filled 2018-06-08 (×2): qty 2

## 2018-06-08 MED ORDER — HALOPERIDOL LACTATE 5 MG/ML IJ SOLN
INTRAMUSCULAR | Status: AC
  Filled 2018-06-08: qty 1

## 2018-06-08 MED ORDER — DIVALPROEX SODIUM ER 500 MG PO TB24
1000.0000 mg | ORAL_TABLET | Freq: Every day | ORAL | Status: DC
  Administered 2018-06-09 (×2): 1000 mg via ORAL
  Filled 2018-06-08 (×4): qty 2

## 2018-06-08 MED ORDER — DIPHENHYDRAMINE HCL 50 MG/ML IJ SOLN: 50.0000 mg | Freq: Four times a day (QID) | INTRAMUSCULAR | Status: DC | PRN

## 2018-06-08 MED ORDER — HALOPERIDOL LACTATE 5 MG/ML IJ SOLN
5.0000 mg | Freq: Four times a day (QID) | INTRAMUSCULAR | Status: DC | PRN
  Administered 2018-06-10: 5 mg via INTRAMUSCULAR
  Filled 2018-06-08: qty 1

## 2018-06-08 MED ORDER — QUETIAPINE FUMARATE 50 MG PO TABS
50.0000 mg | ORAL_TABLET | Freq: Once | ORAL | Status: DC
  Filled 2018-06-08: qty 1

## 2018-06-08 NOTE — Progress Notes (Signed)
Recreation Therapy Notes  Date: 7.19.19 Time: 0930 Location: [address] Dayroom  Group Topic: Stress Management  Goal Area(s) Addresses:  Patient will verbalize importance of using healthy stress management.  Patient will identify positive emotions associated with healthy stress management.   Behavioral Response: Engaged  Intervention: Stress Management  Activity :  [employer] Meditation.  LRT introduced the stress management technique of meditation.  LRT played a played a meditation that allowed patients to take on the characteristics of a mountain.  Patients were to listen and follow along as the meditation.  Education:  Stress Management, Discharge Planning.   Education Outcome: Acknowledges edcuation/In group clarification offered/Needs additional education  Clinical Observations/Feedback: Pt attended and participated in group.      Caroll RancherMarjette Velva Molinari, LRT/CTRS     Lillia AbedLindsay, Milburn Freeney A 06/08/2018 11:21 AM
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
## 2018-06-08 NOTE — Tx Team (Addendum)
Interdisciplinary Treatment and Diagnostic Plan Update  06/08/2018 Time of Session: 9:17AM  Kyle Wang MRN: [id number]  Principal Diagnosis: Bipolar affective disorder, depressed, severe (HCC)  Secondary Diagnoses: Principal Problem:   Bipolar affective disorder, depressed, severe (HCC) Active Problems:   Borderline personality disorder (HCC)   Current Medications:  Current Facility-Administered Medications  Medication Dose Route Frequency Provider Last Rate Last Dose  . acetaminophen (TYLENOL) tablet 650 mg  650 mg Oral Q6H PRN Charm Rings, NP   650 mg at 06/06/18 1438  . alum & mag hydroxide-simeth (MAALOX/MYLANTA) 200-200-20 MG/5ML suspension 30 mL  30 mL Oral Q4H PRN Charm Rings, NP      . citalopram (CELEXA) tablet 40 mg  40 mg Oral Daily Charm Rings, NP   40 mg at 06/08/18 0742  . divalproex (DEPAKOTE ER) 24 hr tablet 1,000 mg  1,000 mg Oral QHS Antonieta Pert, MD      . hydrOXYzine (ATARAX/VISTARIL) tablet 25 mg  25 mg Oral TID PRN Jackelyn Poling, NP   25 mg at 06/07/18 2234  . magnesium hydroxide (MILK OF MAGNESIA) suspension 30 mL  30 mL Oral Daily PRN Charm Rings, NP      . Melatonin TABS 3 mg  3 mg Oral QHS Nira Conn A, NP      . meloxicam (MOBIC) tablet 15 mg  15 mg Oral Daily Charm Rings, NP   15 mg at 06/08/18 0742  . nicotine polacrilex (NICORETTE) gum 2 mg  2 mg Oral PRN Antonieta Pert, MD   2 mg at 06/08/18 0814  . QUEtiapine (SEROQUEL) tablet 50 mg  50 mg Oral Once Nira Conn A, NP       PTA Medications: Medications Prior to Admission  Medication Sig Dispense Refill Last Dose  . citalopram (CELEXA) 40 MG tablet Take 40 mg by mouth daily.   06/05/2018 at Unknown time  . divalproex (DEPAKOTE) 500 MG DR tablet Take 1 tablet (500 mg total) by mouth every 12 (twelve) hours. 60 tablet 1 06/05/2018 at Unknown time  . meloxicam (MOBIC) 15 MG tablet Take 15 mg by mouth daily.   06/05/2018 at Unknown time    Patient Stressors:  Marital or family conflict Medication change or noncompliance  Patient Strengths: Ability for insight Average or above average intelligence Capable of independent living General fund of knowledge  Treatment Modalities: Medication Management, Group therapy, Case management,  1 to 1 session with clinician, Psychoeducation, Recreational therapy.   Physician Treatment Plan for Primary Diagnosis: Bipolar affective disorder, depressed, severe (HCC) Long Term Goal(s): Improvement in symptoms so as ready for discharge Improvement in symptoms so as ready for discharge   Short Term Goals: Ability to identify changes in lifestyle to reduce recurrence of condition will improve Ability to verbalize feelings will improve Ability to disclose and discuss suicidal ideas Ability to demonstrate self-control will improve Ability to identify and develop effective coping behaviors will improve Ability to identify changes in lifestyle to reduce recurrence of condition will improve Ability to verbalize feelings will improve Ability to disclose and discuss suicidal ideas Ability to demonstrate self-control will improve Ability to identify and develop effective coping behaviors will improve  Medication Management: Evaluate patient's response, side effects, and tolerance of medication regimen.  Therapeutic Interventions: 1 to 1 sessions, Unit Group sessions and Medication administration.  Evaluation of Outcomes: Progressing  Physician Treatment Plan for Secondary Diagnosis: Principal Problem:   Bipolar affective disorder, depressed, severe (HCC) Active Problems:  Borderline personality disorder (HCC)  Long Term Goal(s): Improvement in symptoms so as ready for discharge Improvement in symptoms so as ready for discharge   Short Term Goals: Ability to identify changes in lifestyle to reduce recurrence of condition will improve Ability to verbalize feelings will improve Ability to disclose and discuss  suicidal ideas Ability to demonstrate self-control will improve Ability to identify and develop effective coping behaviors will improve Ability to identify changes in lifestyle to reduce recurrence of condition will improve Ability to verbalize feelings will improve Ability to disclose and discuss suicidal ideas Ability to demonstrate self-control will improve Ability to identify and develop effective coping behaviors will improve     Medication Management: Evaluate patient's response, side effects, and tolerance of medication regimen.  Therapeutic Interventions: 1 to 1 sessions, Unit Group sessions and Medication administration.  Evaluation of Outcomes: Progressing   RN Treatment Plan for Primary Diagnosis: Bipolar affective disorder, depressed, severe (HCC) Long Term Goal(s): Knowledge of disease and therapeutic regimen to maintain health will improve  Short Term Goals: Ability to verbalize frustration and anger appropriately will improve and Ability to identify and develop effective coping behaviors will improve  Medication Management: RN will administer medications as ordered by provider, will assess and evaluate patient's response and provide education to patient for prescribed medication. RN will report any adverse and/or side effects to prescribing provider.  Therapeutic Interventions: 1 on 1 counseling sessions, Psychoeducation, Medication administration, Evaluate responses to treatment, Monitor vital signs and CBGs as ordered, Perform/monitor CIWA, COWS, AIMS and Fall Risk screenings as ordered, Perform wound care treatments as ordered.  Evaluation of Outcomes: Progressing   LCSW Treatment Plan for Primary Diagnosis: Bipolar affective disorder, depressed, severe (HCC) Long Term Goal(s): Safe transition to appropriate next level of care at discharge, Engage patient in therapeutic group addressing interpersonal concerns.  Short Term Goals: Engage patient in aftercare planning  with referrals and resources, Increase ability to appropriately verbalize feelings and Increase emotional regulation  Therapeutic Interventions: Assess for all discharge needs, 1 to 1 time with Social worker, Explore available resources and support systems, Assess for adequacy in community support network, Educate family and significant other(s) on suicide prevention, Complete Psychosocial Assessment, Interpersonal group therapy.  Evaluation of Outcomes: Progressing   Progress in Treatment: Attending groups: Yes. Participating in groups: Yes. Taking medication as prescribed: Yes. Toleration medication: Yes. Family/Significant other contact made: Yes, individual(s) contacted:  Mother/legal guardian Patient understands diagnosis: Yes. Discussing patient identified problems/goals with staff: Yes. Medical problems stabilized or resolved: Yes. Denies suicidal/homicidal ideation: Yes. Issues/concerns per patient self-inventory: No. Other: NA  New problem(s) identified: No, Describe:  NA  New Short Term/Long Term Goal(s):  Help with anger, placement/housing options  Patient Goals:  "Learn coping skills to control anger"  Discharge Plan or Barriers:  CSW assessing for appropriate referrals. [facility] pamphlet, Mobile Crisis information, and AA/NA information provided to patient for additional community support and resources.    Reason for Continuation of Hospitalization: Aggression  Estimated Length of Stay:  Tentative discharge in 3-5 days  Attendees: Patient:  Kyle Wang 06/08/2018 9:17 AM  Physician: Dr. Jola Babinskilary 06/08/2018 9:17 AM  Nursing: Mardi MainlandElissa 06/08/2018 9:17 AM  RN Care Manager:  Juliann ParesX 06/08/2018 9:17 AM  Social Worker: Kyle Beringegina Tamirra Sienkiewicz, LCSW 06/08/2018 9:17 AM  Recreational Therapist: Juliann ParesX 06/08/2018 9:17 AM  Other:  06/08/2018 9:17 AM  Other:  06/08/2018 9:17 AM  Other: 06/08/2018 9:17 AM    Scribe for Treatment Team:  Kyle Beringegina Kaeleb Emond, MSW, LCSW Clinical Social  Work 06/08/2018 9:17 AM
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
## 2018-06-08 NOTE — ED Notes (Signed)
Bed: [facility] Expected date:  Expected time:  Means of arrival:  Comments: 

## 2018-06-08 NOTE — Progress Notes (Addendum)
Pt up not getting any sleep , 1 x 50 mg Seroquel ordered per Nira ConnJason Berry

## 2018-06-08 NOTE — ED Triage Notes (Signed)
[facility]
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
## 2018-06-08 NOTE — [facility] Counselor (Signed)
CSW spoke with Kyle Wang/Mother and legal guardian at [phone]. Discussed discharge plan. Mother stated she is working with [name] to obtain another Care Coordinator who can assist with placement. Mother stated that patient cannot return to her home upon discharge. She stated patient requires a level of care that she is unable to provide due to having a disabled adult who also lives in the home. CSW discussed guardian's options for placement; she mentioned perhaps emergency housing.

## 2018-06-08 NOTE — [facility] Group Notes (Signed)
[facility] LCSW GROUP THERAPY NOTE  Date:  06/08/2018    1:15PM  Type of Therapy and Topic:  Group Therapy:  Feelings Around Relapse and Recovery  Participation Level Active  Mood:  Engaged   Description of Group:  Patients in this group will discuss emotions they experience before and after a relapse. They will process how experiencing these feelings, or avoidance of experiencing them, relates to having a relapse. Facilitator will guide patients to explore emotions they have related to recover. Patients will be encouraged to process which emotions are more powerful. They will be guided to discuss the emotional reaction significant others in their lives may have to patients' relapse or recovery. Patients will be assisted in exploring ways to respond to the emotions of others without this contributing to a relapse.   Therapeutic Goals:  1.  Patient will identify two or more emotions that lead to relapse for them.  2.  Patient will identify two emotions that result when they relapse.  3.  Patient will identify two emotions related to recovery.  4.  Patient will demonstrate ability to communicate their needs through discussion and/or role plays.   Summary of Patient Progress: Patient actively participated in group discussion today. He was hyper-focused on how his mother causes him to relapse all the time instead of focusing on his part in relapsing. He stated that he likes to drink alcohol a lot. However, he could not see how his behaviors caused his mother to treat him the way he says she treats him. He was also focused on becoming his own guardian.   Therapeutic Modalities: Cognitive Behavioral Therapy Solution-Focused Therapy Motivational Interviewing   Roselyn Beringegina Heleena Miceli, MSW, LCSW Clinical Social Work

## 2018-06-08 NOTE — Plan of Care (Signed)
  Problem: Education: Goal: Knowledge of [place] [facility] Education information/materials will improve Outcome: Progressing Goal: Emotional status will improve Outcome: Progressing Goal: Mental status will improve Outcome: Progressing Goal: Verbalization of understanding the information provided will improve Outcome: Progressing   Problem: Activity: Goal: Interest or engagement in activities will improve Outcome: Progressing Goal: Sleeping patterns will improve Outcome: Progressing   Problem: Coping: Goal: Ability to verbalize frustrations and anger appropriately will improve Outcome: Progressing Goal: Ability to demonstrate self-control will improve Outcome: Progressing   Problem: Health Behavior/Discharge Planning: Goal: Identification of resources available to assist in meeting health care needs will improve Outcome: Progressing Goal: Compliance with treatment plan for underlying cause of condition will improve Outcome: Progressing   Problem: Physical Regulation: Goal: Ability to maintain clinical measurements within normal limits will improve Outcome: Progressing   Problem: Safety: Goal: Periods of time without injury will increase Outcome: Progressing   Problem: Education: Goal: Ability to make informed decisions regarding treatment will improve Outcome: Progressing   Problem: Coping: Goal: Coping ability will improve Outcome: Progressing   Problem: Health Behavior/Discharge Planning: Goal: Identification of resources available to assist in meeting health care needs will improve Outcome: Progressing   Problem: Medication: Goal: Compliance with prescribed medication regimen will improve Outcome: Progressing   Problem: Self-Concept: Goal: Ability to disclose and discuss suicidal ideas will improve Outcome: Progressing Goal: Will verbalize positive feelings about self Outcome: Progressing   Problem: Education: Goal: Utilization of techniques to improve  thought processes will improve Outcome: Progressing Goal: Knowledge of the prescribed therapeutic regimen will improve Outcome: Progressing   Problem: Activity: Goal: Interest or engagement in leisure activities will improve Outcome: Progressing Goal: Imbalance in normal sleep/wake cycle will improve Outcome: Progressing   Problem: Coping: Goal: Coping ability will improve Outcome: Progressing Goal: Will verbalize feelings Outcome: Progressing   Problem: Health Behavior/Discharge Planning: Goal: Ability to make decisions will improve Outcome: Progressing Goal: Compliance with therapeutic regimen will improve Outcome: Progressing   Problem: Role Relationship: Goal: Will demonstrate positive changes in social behaviors and relationships Outcome: Progressing   Problem: Safety: Goal: Ability to disclose and discuss suicidal ideas will improve Outcome: Progressing Goal: Ability to identify and utilize support systems that promote safety will improve Outcome: Progressing   Problem: Self-Concept: Goal: Will verbalize positive feelings about self Outcome: Progressing Goal: Level of anxiety will decrease Outcome: Progressing   Problem: Education: Goal: Knowledge of disease or condition will improve Outcome: Progressing Goal: Understanding of discharge needs will improve Outcome: Progressing   Problem: Health Behavior/Discharge Planning: Goal: Ability to identify changes in lifestyle to reduce recurrence of condition will improve Outcome: Progressing Goal: Identification of resources available to assist in meeting health care needs will improve Outcome: Progressing   Problem: Physical Regulation: Goal: Complications related to the disease process, condition or treatment will be avoided or minimized Outcome: Progressing   Problem: Safety: Goal: Ability to remain free from injury will improve Outcome: Progressing   
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
## 2018-06-08 NOTE — ED Notes (Addendum)
Pt resting in bed comfortably. Equal rise and fall of chest noted. NAD noted. Sitter remains at bedside

## 2018-06-08 NOTE — Progress Notes (Signed)
Patient self inventory- Patient slept well last night, sleep medication was requested and was helpful. Appetite has been good, energy level is hyper, concentration poor. Patient rates depression, anxiety, and hopelessness all 0 out of 10. Denies SI HI AVH. Patient endorses right knee pain rated 7 out of 10. Patient's goal is to work on his anger. Patient has requested he does not move back in with his mom.  Patient compliant with medications prescribed per provider. Safety maintained with 15 minute checks as well as environmental checks. Will continue to monitor.

## 2018-06-08 NOTE — ED Notes (Signed)
Bed: WLPT1 Expected date:  Expected time:  Means of arrival:  Comments: 

## 2018-06-09 DIAGNOSIS — F314 Bipolar disorder, current episode depressed, severe, without psychotic features: Principal | ICD-10-CM

## 2018-06-09 IMAGING — CR DG CHEST 2V
1 series · 2 of 2 positions shown · non-contrast
Comparison: 05/31/2016 CXR

CLINICAL DATA: Retrosternal chest pain for several hours with
dyspnea and diaphoresis.

EXAM:
CHEST  2 VIEW

[Series 1: dg chest 2 view · 0.14mm/px · 2 of 2 slices shown]
[im 1/2]
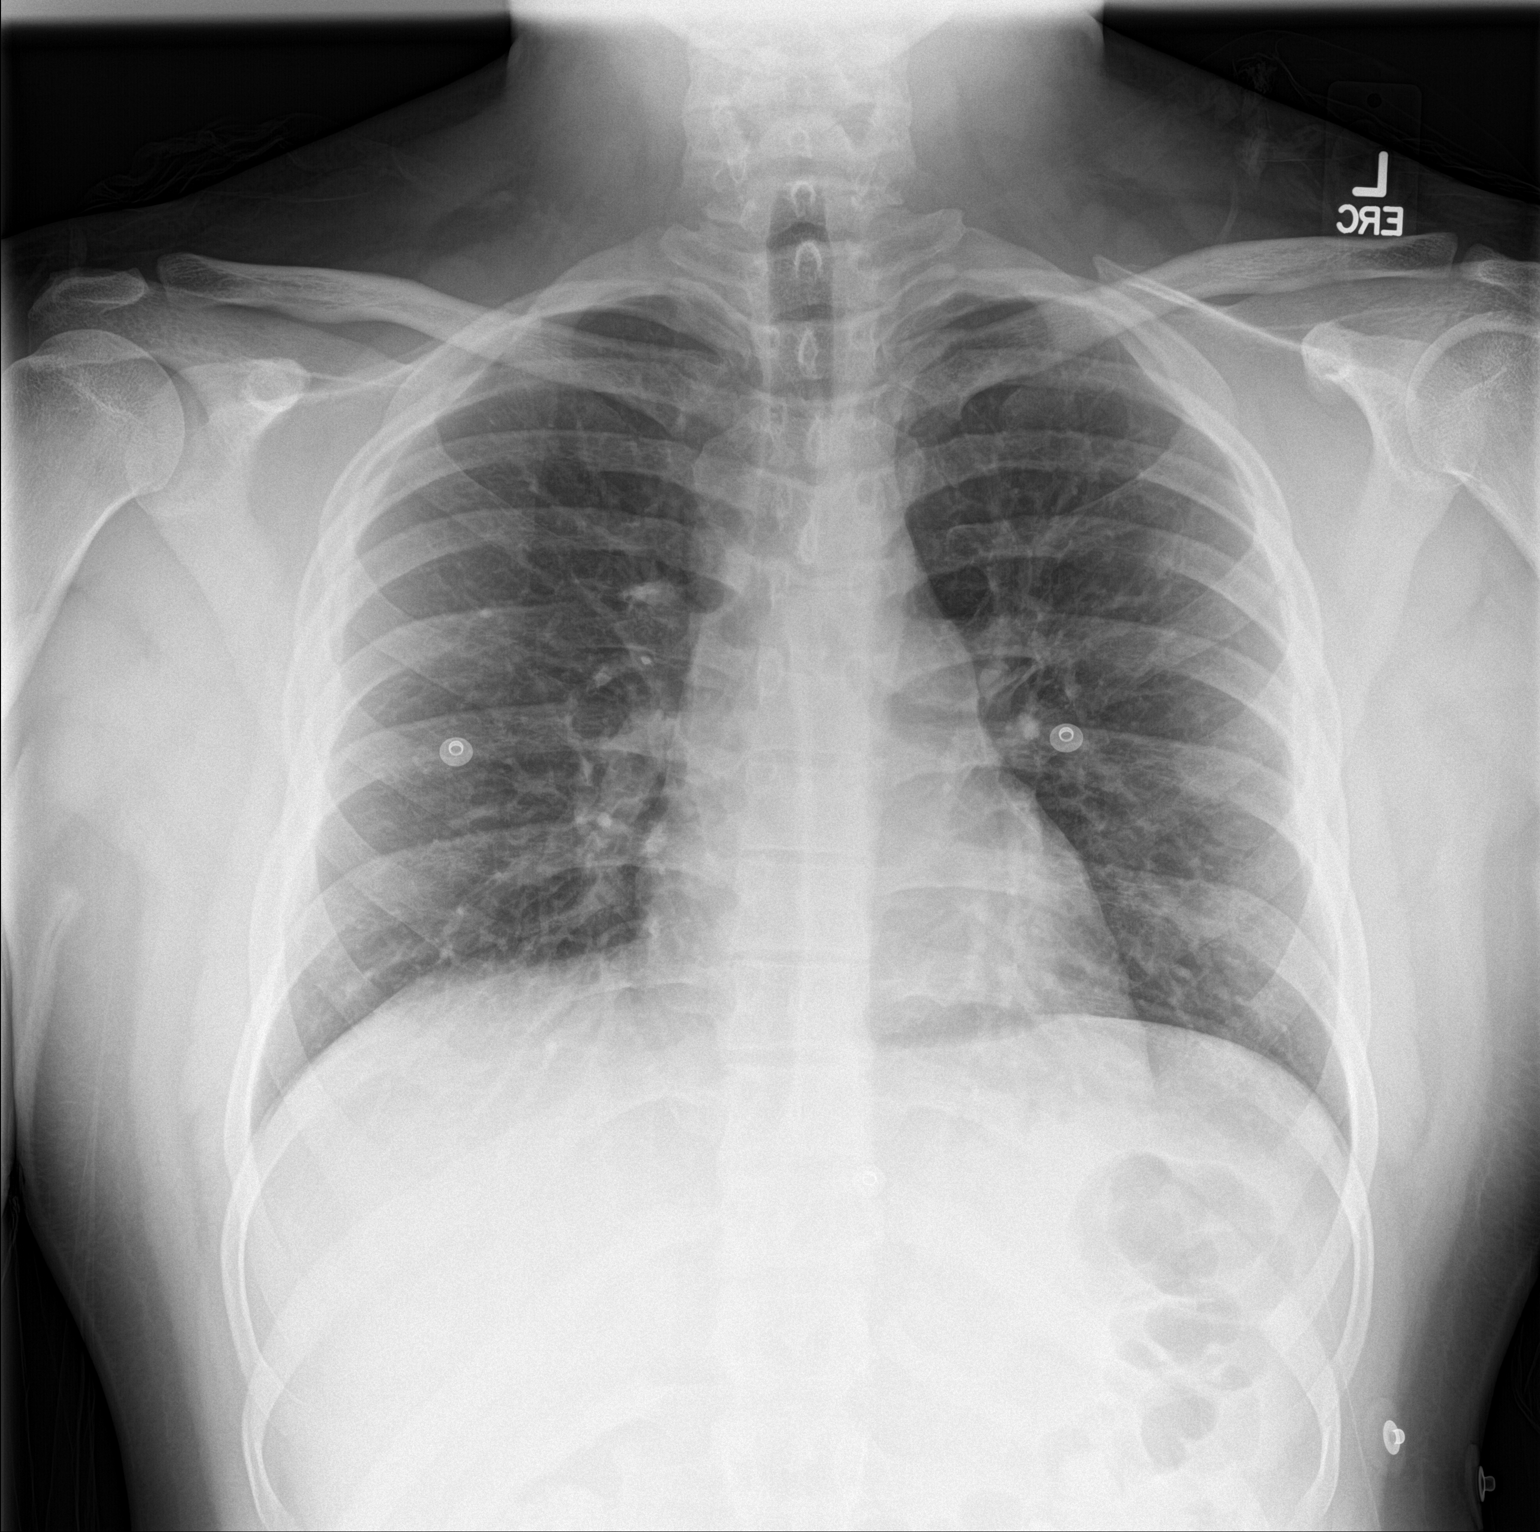
[im 2/2]
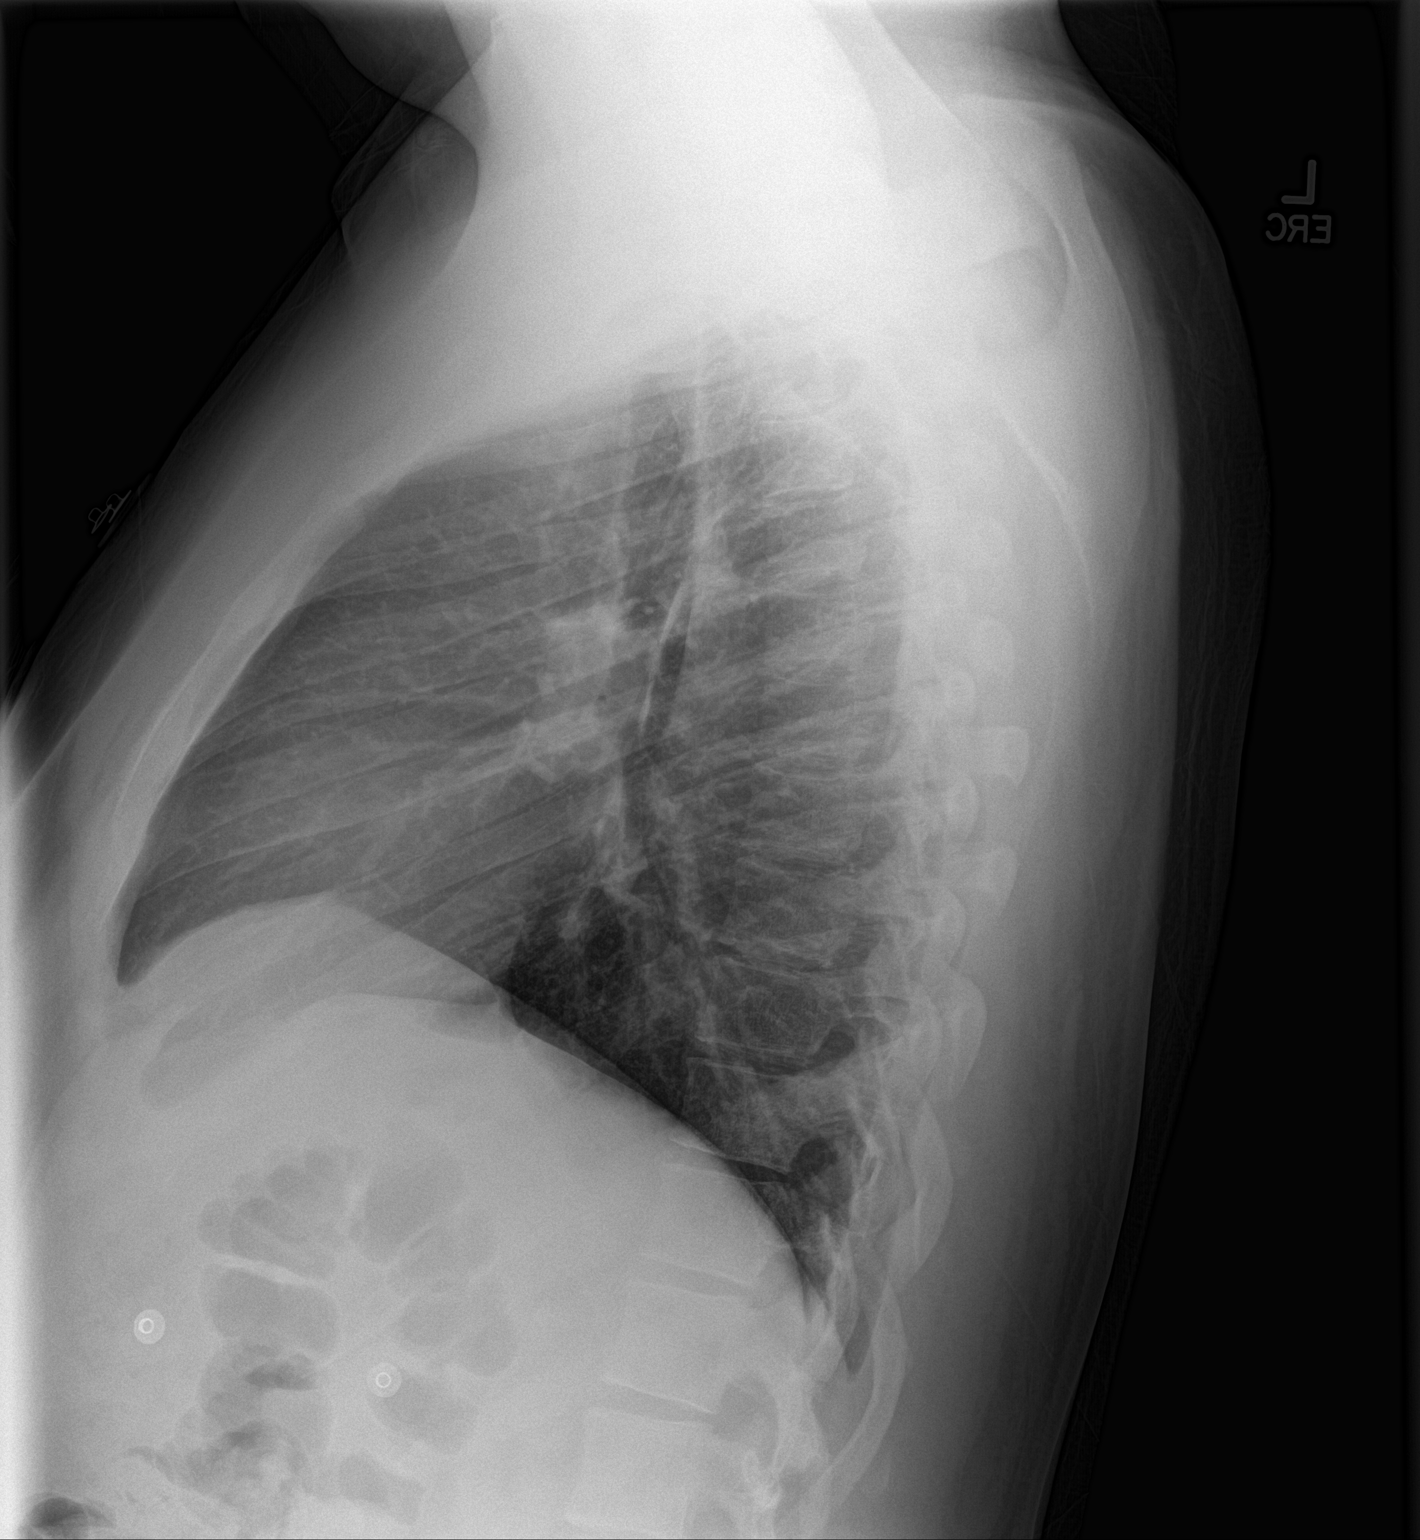

[2 of 2 positions shown; findings below may reference images not displayed]

FINDINGS: The heart size and mediastinal contours are within normal limits.
Minimal atelectasis at the lung bases. No pneumonic consolidation or
CHF. No pleural effusion or pneumothorax. The visualized skeletal
structures are unremarkable.
IMPRESSION: No active cardiopulmonary disease.

## 2018-06-09 MED ORDER — RISPERIDONE 0.5 MG PO TABS
0.5000 mg | ORAL_TABLET | Freq: Two times a day (BID) | ORAL | Status: DC
  Administered 2018-06-09 – 2018-06-10 (×3): 0.5 mg via ORAL
  Filled 2018-06-09 (×5): qty 1

## 2018-06-09 NOTE — Progress Notes (Signed)
Patient did attend the evening speaker AA meeting.  

## 2018-06-09 NOTE — ED Notes (Signed)
[phone]
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
## 2018-06-09 NOTE — [facility] Group Notes (Signed)
LCSW Group Therapy Note  06/09/2018    10:30-11:30am   Type of Therapy and Topic:  Group Therapy: Anger and Coping Skills  Participation Level:  Did Not Attend   Description of Group:   In this group, patients learned how to recognize the physical, cognitive, emotional, and behavioral responses they have to anger-provoking situations.  They identified how they usually or often react when angered, and learned how healthy and unhealthy coping skills work initially, but the unhealthy ones stop working.   They analyzed how their frequently-chosen coping skill is possibly beneficial and how it is possibly unhelpful.  The group discussed a variety of healthier coping skills that could help in resolving the actual issues, as well as how to go about planning for the the possibility of future similar situations.  Therapeutic Goals: 1. Patients will identify one thing that makes them angry and how they feel emotionally and physically, what their thoughts are or tend to be in those situations, and what healthy or unhealthy coping mechanism they typically use 2. Patients will identify how their coping technique works for them, as well as how it works against them. 3. Patients will explore possible new behaviors to use in future anger situations. 4. Patients will learn that anger itself is normal and cannot be eliminated, and that healthier coping skills can assist with resolving conflict rather than worsening situations.  Summary of Patient Progress:  N/A  Therapeutic Modalities:   Cognitive Behavioral Therapy Motivation Interviewing  Nieves Chapa J Grossman-Orr  .  

## 2018-06-09 NOTE — Plan of Care (Signed)
D: Pt denies SI/HI/AVH. Pt came back from getting X-rays of neck and knee 1:22 , per ER pt clear.  A: Pt was offered support and encouragement. Pt was given scheduled medications. Q 15 minute checks were done for safety.  R: safety maintained on unit.   Problem: Activity: Goal: Sleeping patterns will improve Outcome: Progressing   Problem: Safety: Goal: Periods of time without injury will increase Outcome: Progressing

## 2018-06-09 NOTE — Plan of Care (Signed)
Pt progressing in the following metrics  D: pt presented to the medication window, "groggy". Pt stated he slept well last night. Pt had the option to disrupt the milieu again when he found out he was on unit restriction. Pt was verbally deescalated but requested medication. Pt denies any depression/hopelessness/anxiety rating these all a 0 out of 10. Pt denies any si/hi/ah/vh and verbally agrees to approach staff if these become apparent. Pt compliant with medication administration. Pt denied any physical pain to this writer but put his pain at a 7 out of 10 with no location. Pt states he wants to work on his anger today and will achieve this by talking.  A: pt provided support, encouragement, and motivation. Pt provided medications per protocol and standing orders. Q1533m safety checks implemented and continued. R: pt safe on the unit. UR still in place. Will continue to monitor.   Problem: Education: Goal: Emotional status will improve Outcome: Progressing Goal: Mental status will improve Outcome: Progressing Goal: Verbalization of understanding the information provided will improve Outcome: Progressing   Problem: Activity: Goal: Interest or engagement in activities will improve Outcome: Progressing Goal: Sleeping patterns will improve Outcome: Progressing   Problem: Coping: Goal: Ability to verbalize frustrations and anger appropriately will improve Outcome: Progressing   Problem: Health Behavior/Discharge Planning: Goal: Identification of resources available to assist in meeting health care needs will improve Outcome: Progressing   Problem: Physical Regulation: Goal: Ability to maintain clinical measurements within normal limits will improve Outcome: Progressing   Problem: Safety: Goal: Periods of time without injury will increase Outcome: Progressing   Problem: Coping: Goal: Coping ability will improve Outcome: Progressing   Problem: Health Behavior/Discharge  Planning: Goal: Identification of resources available to assist in meeting health care needs will improve Outcome: Progressing   Problem: Medication: Goal: Compliance with prescribed medication regimen will improve Outcome: Progressing   Problem: Self-Concept: Goal: Ability to disclose and discuss suicidal ideas will improve Outcome: Progressing   Problem: Activity: Goal: Interest or engagement in leisure activities will improve Outcome: Progressing Goal: Imbalance in normal sleep/wake cycle will improve Outcome: Progressing   Problem: Coping: Goal: Will verbalize feelings Outcome: Progressing   Problem: Health Behavior/Discharge Planning: Goal: Ability to make decisions will improve Outcome: Progressing   Problem: Safety: Goal: Ability to disclose and discuss suicidal ideas will improve Outcome: Progressing Goal: Ability to identify and utilize support systems that promote safety will improve Outcome: Progressing   Problem: Education: Goal: Knowledge of disease or condition will improve Outcome: Progressing   Problem: Safety: Goal: Ability to remain free from injury will improve Outcome: Progressing

## 2018-06-09 NOTE — [facility] Group Notes (Signed)
[facility] Group Notes:  (Nursing/MHT/Case Management/Adjunct)  Date:  06/09/2018  Time:  1:15 PM  Type of Therapy:  Psychoeducational Skills  Participation Level:  Did Not Attend  Participation Quality:    Affect:    Cognitive:    Insight:    Engagement in Group:    Modes of Intervention:    Summary of Progress/Problems:  Kyle MiyamotoMichael R King Wang 06/09/2018, 1:26 PM

## 2018-06-10 MED ORDER — HYDROXYZINE HCL 50 MG PO TABS: 50.0000 mg | ORAL_TABLET | Freq: Three times a day (TID) | ORAL | 0 refills | Status: AC | PRN

## 2018-06-10 MED ORDER — RISPERIDONE 0.5 MG PO TABS: 0.5000 mg | ORAL_TABLET | Freq: Every day | ORAL | 0 refills | Status: DC

## 2018-06-10 MED ORDER — MELATONIN 3 MG PO TABS: 3.0000 mg | ORAL_TABLET | Freq: Every day | ORAL | 0 refills | Status: AC

## 2018-06-10 MED ORDER — RISPERIDONE 0.5 MG PO TABS
0.5000 mg | ORAL_TABLET | Freq: Every day | ORAL | Status: DC
  Filled 2018-06-10: qty 1

## 2018-06-10 MED ORDER — NICOTINE POLACRILEX 2 MG MT GUM: 2.0000 mg | CHEWING_GUM | OROMUCOSAL | 0 refills | Status: AC | PRN

## 2018-06-10 MED ORDER — DIVALPROEX SODIUM ER 500 MG PO TB24: 1500.0000 mg | ORAL_TABLET | Freq: Every day | ORAL | 0 refills | Status: AC

## 2018-06-10 MED ORDER — DIVALPROEX SODIUM ER 500 MG PO TB24
1500.0000 mg | ORAL_TABLET | Freq: Every day | ORAL | Status: DC
  Administered 2018-06-10: 1500 mg via ORAL
  Filled 2018-06-10 (×2): qty 3
  Filled 2018-06-10: qty 30

## 2018-06-10 MED ORDER — HYDROXYZINE HCL 50 MG PO TABS
50.0000 mg | ORAL_TABLET | Freq: Three times a day (TID) | ORAL | Status: DC | PRN
  Filled 2018-06-10: qty 20
  Filled 2018-06-10: qty 1

## 2018-06-10 NOTE — Progress Notes (Signed)
Patient attended the evening AA meeting. 

## 2018-06-10 NOTE — [facility] Group Notes (Signed)
[facility] LCSW Group Therapy Note  Date/Time:  06/10/2018 9:00-10:00 or 10:00-11:00AM  Type of Therapy and Topic:  Group Therapy:  Healthy and Unhealthy Supports  Participation Level:  Active   Description of Group:  Patients in this group were introduced to the idea of adding a variety of healthy supports to address the various needs in their lives.Patients discussed what additional healthy supports could be helpful in their recovery and wellness after discharge in order to prevent future hospitalizations.   An emphasis was placed on using counselor, doctor, therapy groups, 12-step groups, and problem-specific support groups to expand supports.  They also worked as a group on developing a specific plan for several patients to deal with unhealthy supports through boundary-setting, psychoeducation with loved ones, and even termination of relationships.   Therapeutic Goals:   1)  discuss importance of adding supports to stay well once out of the hospital  2)  compare healthy versus unhealthy supports and identify some examples of each  3)  generate ideas and descriptions of healthy supports that can be added  4)  offer mutual support about how to address unhealthy supports  5)  encourage active participation in and adherence to discharge plan    Summary of Patient Progress:  The patient was in and out of group because of being called out by providers.  While present, he did actively participate and was the only gorup member to challenge another patient's negative statements about therapy not working, stating he has benefited greatly himself.  He also talked about having knowledge of his specific diagnosis and symptoms.   Therapeutic Modalities:   Motivational Interviewing Brief Solution-Focused Therapy  Ambrose MantleMareida Grossman-Orr, LCSW
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
## 2018-06-10 NOTE — Progress Notes (Signed)
Nurse talked to patient this afternoon about  using his coping skills for self control this afternoon and tonight.  Patient agreed.  
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
## 2018-06-10 NOTE — Plan of Care (Signed)
Nurse discussed depression, anxiety, coping skills with patient.  

## 2018-06-10 NOTE — Progress Notes (Addendum)
D:  Patient's self inventory sheet, patient sleeps good, sleep medication helpful.  Good appetite, hyper energy level, poor concentration.  Rated depression and hopeless 4, anxiety 7.  Denied withdrawals.  Denied SI.  Physical problems, R knee, neck, worst pain in past 24 hours is #7.  Pain medicine "kinda" helpful.  Goal is "talk about it".  Would like conference call with his mom.  No discharge plans.  Concerned about mom's guardianship over him. A:  Medications administered per MD orders.  Emotional support and encouragement given patient. R:  Denied SI and HI, contracts for safety.  Denied A/V hallucinations.  Safety maintained with 15 minute checks. Patient very happy that his UR is completed.  Stated he will not hurt anyone and have good behavior.

## 2018-06-11 MED ORDER — HALOPERIDOL LACTATE 5 MG/ML IJ SOLN: 5.0000 mg | Freq: Once | INTRAMUSCULAR | Status: DC

## 2018-06-11 MED ORDER — RISPERIDONE 2 MG PO TABS: 2.0000 mg | ORAL_TABLET | Freq: Two times a day (BID) | ORAL | 0 refills | Status: AC

## 2018-06-11 MED ORDER — PALIPERIDONE PALMITATE ER 234 MG/1.5ML IM SUSY
234.0000 mg | PREFILLED_SYRINGE | Freq: Once | INTRAMUSCULAR | Status: AC
  Administered 2018-06-11: 234 mg via INTRAMUSCULAR
  Filled 2018-06-11: qty 1.5

## 2018-06-11 MED ORDER — RISPERIDONE 2 MG PO TABS
2.0000 mg | ORAL_TABLET | Freq: Two times a day (BID) | ORAL | Status: DC
Start: 2018-06-11 — End: 2018-06-11
  Administered 2018-06-11: 2 mg via ORAL
  Filled 2018-06-11 (×3): qty 1
  Filled 2018-06-11 (×2): qty 20
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
## 2018-06-11 NOTE — Plan of Care (Signed)
Discharge note  Patient verbalizes readiness for discharge. Follow up plan explained, AVS, Transition record and SRA given. Prescriptions and teaching provided. Belongings returned and signed for. Suicide safety plan completed and signed. Patient verbalizes understanding. Patient denies SI/HI and assures this Clinical research associatewriter he will seek assistance should that change. Patient discharged to lobby with bus pass.  Problem: Education: Goal: Knowledge of [place] [facility] Education information/materials will improve Outcome: Adequate for Discharge Goal: Emotional status will improve Outcome: Adequate for Discharge Goal: Mental status will improve Outcome: Adequate for Discharge Goal: Verbalization of understanding the information provided will improve Outcome: Adequate for Discharge   Problem: Activity: Goal: Interest or engagement in activities will improve Outcome: Adequate for Discharge Goal: Sleeping patterns will improve Outcome: Adequate for Discharge   Problem: Coping: Goal: Ability to verbalize frustrations and anger appropriately will improve Outcome: Adequate for Discharge Goal: Ability to demonstrate self-control will improve Outcome: Adequate for Discharge   Problem: Health Behavior/Discharge Planning: Goal: Identification of resources available to assist in meeting health care needs will improve Outcome: Adequate for Discharge Goal: Compliance with treatment plan for underlying cause of condition will improve Outcome: Adequate for Discharge   Problem: Physical Regulation: Goal: Ability to maintain clinical measurements within normal limits will improve Outcome: Adequate for Discharge   Problem: Safety: Goal: Periods of time without injury will increase Outcome: Adequate for Discharge   Problem: Education: Goal: Ability to make informed decisions regarding treatment will improve Outcome: Adequate for Discharge   Problem: Coping: Goal: Coping ability will improve Outcome:  Adequate for Discharge   Problem: Health Behavior/Discharge Planning: Goal: Identification of resources available to assist in meeting health care needs will improve Outcome: Adequate for Discharge   Problem: Medication: Goal: Compliance with prescribed medication regimen will improve Outcome: Adequate for Discharge   Problem: Self-Concept: Goal: Ability to disclose and discuss suicidal ideas will improve Outcome: Adequate for Discharge Goal: Will verbalize positive feelings about self Outcome: Adequate for Discharge   Problem: Education: Goal: Utilization of techniques to improve thought processes will improve Outcome: Adequate for Discharge Goal: Knowledge of the prescribed therapeutic regimen will improve Outcome: Adequate for Discharge   Problem: Activity: Goal: Interest or engagement in leisure activities will improve Outcome: Adequate for Discharge Goal: Imbalance in normal sleep/wake cycle will improve Outcome: Adequate for Discharge   Problem: Coping: Goal: Coping ability will improve Outcome: Adequate for Discharge Goal: Will verbalize feelings Outcome: Adequate for Discharge   Problem: Health Behavior/Discharge Planning: Goal: Ability to make decisions will improve Outcome: Adequate for Discharge Goal: Compliance with therapeutic regimen will improve Outcome: Adequate for Discharge   Problem: Role Relationship: Goal: Will demonstrate positive changes in social behaviors and relationships Outcome: Adequate for Discharge   Problem: Safety: Goal: Ability to disclose and discuss suicidal ideas will improve Outcome: Adequate for Discharge Goal: Ability to identify and utilize support systems that promote safety will improve Outcome: Adequate for Discharge   Problem: Self-Concept: Goal: Will verbalize positive feelings about self Outcome: Adequate for Discharge Goal: Level of anxiety will decrease Outcome: Adequate for Discharge   Problem: Education: Goal:  Knowledge of disease or condition will improve Outcome: Adequate for Discharge Goal: Understanding of discharge needs will improve Outcome: Adequate for Discharge   Problem: Health Behavior/Discharge Planning: Goal: Ability to identify changes in lifestyle to reduce recurrence of condition will improve Outcome: Adequate for Discharge Goal: Identification of resources available to assist in meeting health care needs will improve Outcome: Adequate for Discharge   Problem: Physical Regulation: Goal:  Complications related to the disease process, condition or treatment will be avoided or minimized Outcome: Adequate for Discharge   Problem: Safety: Goal: Ability to remain free from injury will improve Outcome: Adequate for Discharge

## 2018-06-11 NOTE — Progress Notes (Signed)
Recreation Therapy Notes  Date: 7.22.19 Time: 0930 Location: [address] Dayroom  Group Topic: Stress Management  Goal Area(s) Addresses:  Patient will verbalize importance of using healthy stress management.  Patient will identify positive emotions associated with healthy stress management.   Intervention: Stress Management  Activity :  Guided Imagery.  LRT introduced the stress management technique of guided imagery.  LRT read Wang script that allowed patients to envision floating on Wang cloud.  Patients were to listen and follow along as LRT read script to engage in the activity.  Education:  Stress Management, Discharge Planning.   Education Outcome: Acknowledges edcuation/In group clarification offered/Needs additional education  Clinical Observations/Feedback: Pt did not attend group.     Kyle Wang, LRT/CTRS         Kyle Wang 06/11/2018 1:42 PM 
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
## 2018-06-12 ENCOUNTER — Other Ambulatory Visit: Payer: Self-pay

## 2018-06-12 ENCOUNTER — Emergency Department (HOSPITAL_COMMUNITY)
Admission: EM | Admit: 2018-06-12 | Discharge: 2018-06-13 | Disposition: A | Discharge status: Other Acute Inpatient Hospital | Payer: Medicaid Other | Attending: Emergency Medicine | Admitting: Emergency Medicine

## 2018-06-12 ENCOUNTER — Encounter (HOSPITAL_COMMUNITY): Payer: Self-pay

## 2018-06-12 DIAGNOSIS — Z87891 Personal history of nicotine dependence: Secondary | ICD-10-CM | POA: Insufficient documentation

## 2018-06-12 DIAGNOSIS — Z79899 Other long term (current) drug therapy: Secondary | ICD-10-CM | POA: Insufficient documentation

## 2018-06-12 DIAGNOSIS — T50902A Poisoning by unspecified drugs, medicaments and biological substances, intentional self-harm, initial encounter: Secondary | ICD-10-CM | POA: Insufficient documentation

## 2018-06-12 DIAGNOSIS — J45909 Unspecified asthma, uncomplicated: Secondary | ICD-10-CM | POA: Insufficient documentation

## 2018-06-12 DIAGNOSIS — F314 Bipolar disorder, current episode depressed, severe, without psychotic features: Secondary | ICD-10-CM | POA: Diagnosis present

## 2018-06-12 LAB — ACETAMINOPHEN LEVEL: Acetaminophen (Tylenol), Serum: <10 ug/mL — ABNORMAL LOW (ref 10–30)

## 2018-06-12 LAB — RAPID URINE DRUG SCREEN, HOSP PERFORMED
Amphetamines: NOT DETECTED
Barbiturates: NOT DETECTED
Benzodiazepines: NOT DETECTED
Cocaine: NOT DETECTED
OPIATES: NOT DETECTED
Tetrahydrocannabinol: NOT DETECTED

## 2018-06-12 LAB — COMPREHENSIVE METABOLIC PANEL
ALT: 36 U/L (ref 0–44)
AST: 31 U/L (ref 15–41)
Albumin: 3.8 g/dL (ref 3.5–5.0)
Alkaline Phosphatase: 42 U/L (ref 38–126)
Anion gap: 12 (ref 5–15)
BUN: 12 mg/dL (ref 6–20)
CHLORIDE: 103 mmol/L (ref 98–111)
CO2: 24 mmol/L (ref 22–32)
CREATININE: 0.67 mg/dL (ref 0.61–1.24)
Calcium: 9.3 mg/dL (ref 8.9–10.3)
Glucose, Bld: 171 mg/dL — ABNORMAL HIGH (ref 70–99)
Potassium: 4.2 mmol/L (ref 3.5–5.1)
Sodium: 139 mmol/L (ref 135–145)
TOTAL PROTEIN: 6.6 g/dL (ref 6.5–8.1)
Total Bilirubin: 0.4 mg/dL (ref 0.3–1.2)

## 2018-06-12 LAB — CBC
HCT: 42.8 % (ref 39.0–52.0)
Hemoglobin: 14.1 g/dL (ref 13.0–17.0)
MCH: 31.1 pg (ref 26.0–34.0)
MCHC: 32.9 g/dL (ref 30.0–36.0)
MCV: 94.3 fL (ref 78.0–100.0)
PLATELETS: 311 10*3/uL (ref 150–400)
RBC: 4.54 MIL/uL (ref 4.22–5.81)
RDW: 14.1 % (ref 11.5–15.5)
WBC: 10.3 10*3/uL (ref 4.0–10.5)

## 2018-06-12 LAB — CBG MONITORING, ED: Glucose-Capillary: 183 mg/dL — ABNORMAL HIGH (ref 70–99)

## 2018-06-12 LAB — ETHANOL

## 2018-06-12 LAB — SALICYLATE LEVEL

## 2018-06-12 LAB — MAGNESIUM: MAGNESIUM: 1.8 mg/dL (ref 1.7–2.4)

## 2018-06-12 MED ORDER — ALBUTEROL SULFATE (2.5 MG/3ML) 0.083% IN NEBU
2.5000 mg | INHALATION_SOLUTION | Freq: Once | RESPIRATORY_TRACT | Status: AC
  Administered 2018-06-12: 2.5 mg via RESPIRATORY_TRACT
  Filled 2018-06-12: qty 3

## 2018-06-12 NOTE — ED Notes (Signed)
Spoke with Kyle StandardAllison from [employer]: -Citalopram: -QRS widening/use sodium bicarb boluses until QRS back WNL -Hydroxyzine-tachycardia IVF's -Citalopran and hydroxyzine/seizures-use benzo's -hydroxyzine QTc prolongation >500-obtain Mg++ level and keep Mg++ and K+ levels upper normal limits -Reapeat EKG in 6 hours -OBS for at least 6 hours -Risperadol/drowsiness -Meloxicam GI upset
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
## 2018-06-12 NOTE — Progress Notes (Signed)
   06/12/18 2300  General Assessment Data  Reason for not completing assessment Pt was unable to arouse,  TTS willl follow up at a later time

## 2018-06-12 NOTE — ED Provider Notes (Signed)
[facility]-EMERGENCY DEPT Provider Note   CSN: [id number] Arrival date & time: 06/12/18  2108     History   Chief Complaint Chief Complaint  Patient presents with  . IVC  . Drug Overdose  . Suicidal    HPI Kyle Wang is a 22 y.o. male with a past medical history of bipolar, self-inflicted wrist lacerations, who presents today for evaluation after an intentional overdose.  He reports that his mom has made him upset as she will not let him see his girlfriend.  Mom is patient's legal guardian.  Patient reportedly took 5 to 7 pills of 50 mg hydralazine, 9 pills of meloxicam 7.5 mg, 10 pills of citalopram 40 mg, and 4 pills of risperidone 2 mg.  Patient does report that this was a active attempt to end his life.  He reports that he would have taken his Depakote also, however it would not his mouth with all of his other pills.  He is here under IVC.   HPI  Past Medical History:  Diagnosis Date  . Asthma   . Bipolar 1 disorder (HCC)   . Suicide [facility])     Patient Active Problem List   Diagnosis Date Noted  . Bipolar affective disorder, depressed, severe (HCC) 06/06/2018  . Self-inflicted laceration of wrist 07/12/2016  . Borderline personality disorder (HCC) 07/12/2016  . GERD (gastroesophageal reflux disease) 06/02/2016  . Tobacco use disorder 06/02/2016  . Asthma 06/02/2016  . Suicidal ideation 06/01/2016  . Personality disorder (HCC) 06/01/2016  . Bipolar disorder (HCC) 06/01/2016    Past Surgical History:  Procedure Laterality Date  . CHOLECYSTECTOMY          Home Medications    Prior to Admission medications   Medication Sig Start Date End Date Taking? Authorizing Provider  citalopram (CELEXA) 40 MG tablet Take 40 mg by mouth daily.   Yes [provider]  divalproex (DEPAKOTE ER) 500 MG 24 hr tablet Take 3 tablets (1,500 mg total) by mouth at bedtime. 06/10/18  Yes Starkes, Juel Burrow, FNP  hydrOXYzine (ATARAX/VISTARIL) 50 MG  tablet Take 1 tablet (50 mg total) by mouth 3 (three) times daily as needed for anxiety. 06/10/18  Yes Starkes, Juel Burrow, FNP  Melatonin 3 MG TABS Take 1 tablet (3 mg total) by mouth at bedtime. 06/10/18  Yes Starkes, Juel Burrow, FNP  meloxicam (MOBIC) 15 MG tablet Take 15 mg by mouth daily.   Yes [provider]  risperiDONE (RISPERDAL) 2 MG tablet Take 1 tablet (2 mg total) by mouth 2 (two) times daily. 06/11/18  Yes Oneta Rack, NP  nicotine polacrilex (NICORETTE) 2 MG gum Take 1 each (2 mg total) by mouth as needed for smoking cessation. May pick up OTC 06/10/18   Starkes, Juel Burrow, FNP    Family History Family History  Adopted: Yes    Social History Social History   Tobacco Use  . Smoking status: Former Smoker    Packs/day: 2.00    Types: Cigarettes    Last attempt to quit: 08/18/2013    Years since quitting: 4.8  . Smokeless tobacco: Current User    Types: Chew  Substance Use Topics  . Alcohol use: Yes    Comment: drinks daily  . Drug use: No     Allergies   Patient has no known allergies.   Review of Systems Review of Systems  Constitutional: Negative for chills and fever.  HENT: Negative for ear pain and sore throat.  Eyes: Negative for pain and visual disturbance.  Respiratory: Positive for shortness of breath. Negative for cough and chest tightness.   Cardiovascular: Negative for chest pain and palpitations.  Gastrointestinal: Negative for abdominal pain and vomiting.  Musculoskeletal: Negative for arthralgias and back pain.  Skin: Negative for color change and rash.  Neurological: Negative for seizures, syncope and headaches.  Psychiatric/Behavioral: Positive for self-injury and suicidal ideas.  All other systems reviewed and are negative.    Physical Exam Updated Vital Signs BP 104/75   Pulse 87   Temp 98.1 F (36.7 C) (Oral)   Resp 18   Ht 5\' 7"  (1.702 m)   Wt 102.1 kg (225 lb)   SpO2 98%   BMI 35.24 kg/m   Physical Exam    Constitutional: He appears well-developed and well-nourished. No distress.  HENT:  Head: Normocephalic and atraumatic.  Eyes: Conjunctivae are normal. Right eye exhibits no discharge. Left eye exhibits no discharge. No scleral icterus.  Neck: Normal range of motion.  Cardiovascular: Normal rate and regular rhythm.  Pulmonary/Chest: Effort normal. No stridor. No respiratory distress.  Abdominal: He exhibits no distension.  Musculoskeletal: He exhibits no edema or deformity.  Neurological: He is alert. He exhibits normal muscle tone.  Skin: Skin is warm and dry. He is not diaphoretic.  Psychiatric: His speech is rapid and/or pressured. He is hyperactive. He is not agitated. He expresses impulsivity and inappropriate judgment. He expresses suicidal ideation. He expresses suicidal plans.  Nursing note and vitals reviewed.    ED Treatments / Results  Labs (all labs ordered are listed, but only abnormal results are displayed) Labs Reviewed  COMPREHENSIVE METABOLIC PANEL - Abnormal; Notable for the following components:      Result Value   Glucose, Bld 171 (*)    All other components within normal limits  ACETAMINOPHEN LEVEL - Abnormal; Notable for the following components:   Acetaminophen (Tylenol), Serum <10 (*)    All other components within normal limits  CBG MONITORING, ED - Abnormal; Notable for the following components:   Glucose-Capillary 183 (*)    All other components within normal limits  ETHANOL  SALICYLATE LEVEL  CBC  RAPID URINE DRUG SCREEN, HOSP PERFORMED  MAGNESIUM    EKG EKG Interpretation  Date/Time:  Tuesday June 12 2018 21:21:11 EDT Ventricular Rate:  98 PR Interval:    QRS Duration: 73 QT Interval:  331 QTC Calculation: 423 R Axis:   63 Text Interpretation:  Sinus rhythm Borderline short PR interval Borderline T abnormalities, inferior leads Confirmed by Rolan Bucco [phone]) on 06/12/2018 9:35:15 PM   Radiology No results  found.  Procedures Procedures (including critical care time)  Medications Ordered in ED Medications  albuterol (PROVENTIL) (2.5 MG/3ML) 0.083% nebulizer solution 2.5 mg (2.5 mg Nebulization Given 06/12/18 2304)     Initial Impression / Assessment and Plan / ED Course  I have reviewed the triage vital signs and the nursing notes.  Pertinent labs & imaging results that were available during my care of the patient were reviewed by me and considered in my medical decision making (see chart for details).    Kyle Wang presents today for evaluation after a attempted polysubstance overdose on hydroxyzine zine, meloxicam, citalopram, and risperidone.  He reports that he did this as an attempt to end his life.  He presents today under IVC.  His mother is listed as his legal guardian in the chart.  He reports feeling slightly short of breath, says that he has asthma  and feels like that is flaring up.  He was given an albuterol nebulizer after which she reported significant improvement.  Poison control recommended for 6 hours with repeat 4-hour acetaminophen, salicylate, and EKG.  At shift change care was transferred to K. Leapheart who will follow pending studies, re-evaulate and determine disposition.     Final Clinical Impressions(s) / ED Diagnoses   Final diagnoses:  Suicide attempt by drug ingestion, initial encounter [facility](HCC)    ED Discharge Orders    None       Norman ClayHammond, Jailey Booton W, PA-C 06/12/18 2329    Rolan BuccoBelfi, Melanie, MD 06/12/18 [phone]

## 2018-06-12 NOTE — ED Notes (Addendum)
Bed: RESB Expected date:  Expected time:  Means of arrival:  Comments: EMS 22 yo overdose 10 citalopram 40 mg/6-7 hydroxizine 50 mg,risperidol 2mg  x4 meloxicam 7.5 mg x 9

## 2018-06-12 NOTE — ED Triage Notes (Signed)
[facility] with complaints of OD of multiple meds-patient under IVC with [employer] at bedside. Patient at the shelter. Patient states his Mother won't let him see his girlfriend.

## 2018-06-13 ENCOUNTER — Other Ambulatory Visit: Payer: Self-pay

## 2018-06-13 ENCOUNTER — Encounter (HOSPITAL_COMMUNITY): Payer: Self-pay | Admitting: Behavioral Health

## 2018-06-13 LAB — SALICYLATE LEVEL: Salicylate Lvl: <7 mg/dL (ref 2.8–30.0)

## 2018-06-13 LAB — ACETAMINOPHEN LEVEL: Acetaminophen (Tylenol), Serum: <10 ug/mL — ABNORMAL LOW (ref 10–30)

## 2018-06-13 NOTE — ED Notes (Signed)
Pt discharged safely with Sheriff deputy.  Pt was in no distress.  Pt was calm and cooperative.  All belongings were sent with patient. 

## 2018-06-13 NOTE — ED Notes (Signed)
[facility]
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
## 2018-06-13 NOTE — BH Assessment (Addendum)
[facility] Progress [facility]  Per Juanetta BeetsJacqueline Norman, DO, this pt requires psychiatric hospitalization at this time.  Pt presents under IVC initiated by law enforcement, which Dr Sharma CovertNorman has upheld.  At 15:10 Christiane HaJonathan calls [facility] to report that pt has been accepted to their facility by Dr Wendall StadeKohl.  Nanine MeansJamison Lord, DNP concurs with this decision.  Pt's nurse, Kendal Hymendie, has been notified, and agrees to call report to [phone].  [place] [facility]  At 15:15 this writer called pt's mother/guardian and notified her of pt's disposition.  Doylene Canninghomas Lavaughn Bisig Behavioral Health Coordinator [phone]

## 2018-06-13 NOTE — ED Notes (Signed)
Poison Control update: -Serial EKG's to monitor QT,QTc,every 6 hours until 2000 tonight

## 2018-06-13 NOTE — ED Notes (Signed)
Pts mother is his legal guardian.  Kyle Wang Fabio [phone]

## 2018-06-13 NOTE — BH Assessment (Signed)
[facility] Assessment Progress Note  At 08:50 this Clinical research associatewriter called pt's mother/legal guardian, Melba CoonRose Marie Pauling [phone]).  I notified her [facility], and that he will be seen by psychiatry later today to determine disposition.  I provided her with contact information for myself and for social work.  Doylene Canninghomas Fabiano Ginley, [place] Behavioral Health Coordinator [phone]

## 2018-06-13 NOTE — ED Notes (Signed)
Pt's belongings placed in a bag and tagged, in patient cabinet 16-18

## 2018-06-13 NOTE — ED Notes (Signed)
Poison control suggest monitoring x 24 hours which ends at 2000 tonight with one repeat EKG.  After that he will be cleared if no further changes.

## 2018-06-13 NOTE — ED Notes (Signed)
Pt arrived to unit calm and cooperative.  Sitter at bedside.  All belongings locked in locker 30 with green tag.  Pt promptly fell asleep.

## 2018-06-13 NOTE — ED Provider Notes (Signed)
Care assumed from previous provider PA HAmmond . Please see their note for further details to include full history and physical. To summarize in short pt is a 22 year old male presents to the ED for intentional overdose.. Case discussed, plan agreed upon.   At time of care handoff was awaiting repeat salicylate and Tylenol level 4 hours with EKG monitoring until 6 hours.  Every 6 hours patient can be medically clear.  EKG shows no prolonged QT.  Normal salicylate and Tylenol level.  Patient has remained hemodynamically stable in the ED.  Can be medically cleared for TTS evaluation and disposition.       Rise MuLeaphart, Jarmel Linhardt T, PA-C 06/13/18 0429    Geoffery Lyonselo, Douglas, MD 06/13/18 [phone]
